# Patient Record
Sex: Female | Born: 1940 | Race: White | Hispanic: No | Marital: Married
Health system: Midwestern US, Academic
[De-identification: ages and names within clinical notes are randomized; demographics above are authoritative.]

---

## 2017-02-21 LAB — THYROID STIMULATING HORMONE-TSH: Lab: 1.9 — ABNORMAL HIGH (ref 3.5–5.1)

## 2017-02-21 LAB — CBC: Lab: 7.4

## 2017-02-21 LAB — BASIC METABOLIC PANEL
Lab: 0.8 — ABNORMAL LOW (ref 33.0–37.0)
Lab: 102 — ABNORMAL LOW (ref 37.0–47.0)
Lab: 103
Lab: 136 — ABNORMAL LOW (ref 4.20–5.40)
Lab: 15 — ABNORMAL HIGH (ref 0–14)
Lab: 17
Lab: 24
Lab: 67
Lab: 78
Lab: 9.9

## 2017-03-03 ENCOUNTER — Encounter: Admit: 2017-03-03 | Discharge: 2017-03-03 | Payer: MEDICARE

## 2017-03-03 DIAGNOSIS — R011 Cardiac murmur, unspecified: Principal | ICD-10-CM

## 2017-03-04 ENCOUNTER — Encounter: Admit: 2017-03-04 | Discharge: 2017-03-04 | Payer: MEDICARE

## 2017-03-04 DIAGNOSIS — R0600 Dyspnea, unspecified: ICD-10-CM

## 2017-03-04 DIAGNOSIS — E785 Hyperlipidemia, unspecified: ICD-10-CM

## 2017-03-04 DIAGNOSIS — R55 Syncope and collapse: ICD-10-CM

## 2017-03-04 DIAGNOSIS — R011 Cardiac murmur, unspecified: Principal | ICD-10-CM

## 2017-03-04 DIAGNOSIS — R5383 Other fatigue: ICD-10-CM

## 2017-03-04 NOTE — Nursing Note
Dr. Orson GearJessica Stone requested soon OV for evaluation near syncope, murmur, moderate mitral regurg, moderate aortic regurg.  Patient reports she had an near syncopal episode appox 3 weeks ago and has had some chest "burning", but no sx have improved and she now reports fatigue.  Echo report received.

## 2017-03-07 ENCOUNTER — Encounter: Admit: 2017-03-07 | Discharge: 2017-03-07 | Payer: MEDICARE

## 2017-03-07 ENCOUNTER — Ambulatory Visit: Admit: 2017-03-07 | Discharge: 2017-03-08 | Payer: MEDICARE

## 2017-03-07 DIAGNOSIS — R011 Cardiac murmur, unspecified: Principal | ICD-10-CM

## 2017-03-07 DIAGNOSIS — R55 Syncope and collapse: ICD-10-CM

## 2017-03-07 DIAGNOSIS — R5383 Other fatigue: ICD-10-CM

## 2017-03-07 DIAGNOSIS — E782 Mixed hyperlipidemia: ICD-10-CM

## 2017-03-07 DIAGNOSIS — R0600 Dyspnea, unspecified: ICD-10-CM

## 2017-03-07 DIAGNOSIS — E785 Hyperlipidemia, unspecified: Secondary | ICD-10-CM

## 2017-03-07 DIAGNOSIS — I10 Essential (primary) hypertension: ICD-10-CM

## 2017-03-07 NOTE — Progress Notes
Date of Service: 03/07/2017    Brianna Christensen is a 76 y.o. female.       HPI     This is a delightfully pleasant 76 year old female whom is referred to the department of cardiovascular medicine clinic of the [facility] system at the [place], [place] office today for initial cardiovascular consultation.  She recently experienced 2 episodes of feeling presyncopal.  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  The second occurred while fishing when she tripped over a tackle box.  She did not suffer a loss of consciousness.  She had a sensation of her heart racing.  She thinks that her blood pressure was low at the time.  She is checked her blood pressure at home recently and gotten values of 118/69 mmHg with a pulse rate of around 89 bpm.  She did not have heart rate or blood pressure data from the time of the events.  Of note, her home readings are obtained on a wrist blood pressure cuff.  She was seen her primary care provider office and noted to be hypertensive with a systolic blood pressure of 156/78 mmHg.  She was found to have a newly discovered cardiac murmur and therefore referred for an echocardiogram.    Her transthoracic echocardiogram was performed at the [facility] on February 25, 2017.  He received a copy of the report but not the images.  The left ventricular ejection fraction was 62% by Simpson biplane.  The atrium were normal in size.  The left ventricle was felt to be normal in size.  Right ventricle was felt to be mildly dilated.  The aortic valve was noted to have 3 cusps with a mildly thickened and calcific appearance.  The aortic valve peak velocity was not documented.  The mean gradient was 7 mmHg there was felt to be moderate aortic regurgitation the mitral valve was noted to have a mildly thickened appearance with moderate central mitral regurgitation.  There was mild tricuspid regurgitation with a estimated peak RV systolic pressure of 35 mmHg.  She also had recent lab work performed on February 21, 2017.  A CBC was pertinent only for a hemoglobin that was slightly low at 11.7 g/dL, a BMP showed a potassium of 5.2 and anion gap of 15 but was otherwise unremarkable.  A fasting lipid profile performed on November 2017 showed a total cholesterol of 183, triglycerides 73, HDL 66, and LDL of 95.      Upon review of systems, the patient denies chest discomfort.  She denies dyspnea on exertion.  She denies orthopnea, PND, or any lower extremity edema.  She denies any separate episodes of palpitations exclusive of the 2 mentioned events.  She denies symptoms of claudication.  She denies fevers or chills.  She denies claudication.  She denies focal neurologic deficits.         Vitals:    03/07/17 1345 03/07/17 1418   BP: 152/90 162/82   Pulse: 80    Weight: 84.9 kg (187 lb 3.2 oz)    Height: 1.651 m (5' 5)      Body mass index is 31.15 kg/m???.     Past Medical History  Patient Active Problem List    Diagnosis Date Noted   ??? Essential hypertension 03/07/2017   ??? Fatigue 03/04/2017   ??? Hyperlipidemia 03/04/2017     Currently on Crestor 10 mg     ??? Near syncope 03/04/2017   ???  Murmur 03/03/2017     02/25/2017 Echo at [facility]  Mitral valve leaflets appear mildly thickened.  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  The mitral regurgitation jet was central. 2.  Trileaflet aortic valve.  The aortic cusps appear mildly thickened.  Aortic cusps appear mildly calcified.  The peak transaortic gradient was 17.00 mmHg.  The mean transaortic gradient was 7.00 mmHg.  Moderate aortic valve regurgitation.  Normal LV systolic function.  LV EF 62%.  Normal left ventricular wall thickness.            Review of Systems   Constitution: Positive for malaise/fatigue.   HENT: Negative.    Eyes: Negative.    Cardiovascular: Negative.    Respiratory: Positive for shortness of breath. Endocrine: Negative.    Hematologic/Lymphatic: Negative.    Skin: Negative.    Musculoskeletal: Negative.    Gastrointestinal: Negative.    Genitourinary: Negative.    Neurological: Positive for light-headedness.   Psychiatric/Behavioral: Negative.    Allergic/Immunologic: Negative.        Physical Exam   Nursing note and vitals reviewed.  Constitutional: She appears well-developed and well-nourished. No distress.   HENT:   Head: Normocephalic and atraumatic.   Nose: Nose normal.   Mouth/Throat: Oropharynx is clear and moist.   Eyes: Conjunctivae and EOM are normal. Pupils are equal, round, and reactive to light. No scleral icterus.   Neck: Normal range of motion. No JVD present. Carotid bruit is not present (bilaterally).   Cardiovascular: Normal rate, regular rhythm and intact distal pulses.  Exam reveals no gallop and no friction rub.    Murmur heard.   Harsh midsystolic murmur is present with a grade of 3/6  at the upper right sternal border radiating to the neck  Pulmonary/Chest: Effort normal and breath sounds normal. No respiratory distress. She has no wheezes. She has no rales.   Abdominal: Soft. Bowel sounds are normal. She exhibits no distension. There is no tenderness. There is no guarding.   Musculoskeletal: Normal range of motion. She exhibits no edema or tenderness.   Lymphadenopathy:     She has no cervical adenopathy.   Neurological: She is alert and oriented to person, place, and time. No cranial nerve deficit. Coordination normal.   Skin: Skin is warm and dry. No rash noted. She is not diaphoretic. No erythema.   Psychiatric: She has a normal mood and affect. Her behavior is normal.         Cardiovascular Studies  ECG today demonstrates sinus rhythm at a rate of 80 bpm.  The axis is normal.  The intervals are normal.  There are no prior studies available for comparison.    Problems Addressed Today  Encounter Diagnoses   Name Primary?   ??? Murmur Yes   ??? Near syncope    ??? Mixed hyperlipidemia ??? Essential hypertension        Assessment and Plan     1. Moderate aortic regurgitation and mitral regurgitation  ??? Unlikely to be the source of her symptoms at the present time  ??? However, this will require long-term monitoring.  I would like to repeat a resting echocardiogram in approximately 6 months time to evaluate for any rapid progression, which I do think will ultimately be unlikely    2. Presyncope with reported associated hypotension  ??? The patient is actually hypertensive on every objective measurements since that time.  ??? I did discuss that her symptoms are concerning for presyncope related to hypotension however.  We discussed p.o. fluid intake, which sounds adequate at this time.  ??? I did ask her to take her blood pressure cuff into her local provider office to check it against the available machine there for accuracy.  ??? I would like to check a treadmill thallium myocardial perfusion study as a screen for underlying coronary artery disease.  Although this is less likely as a cause of presyncope, females tend to have atypical symptoms.  Her risk factors include age, hypertension, and dyslipidemia.    3. Dyslipidemia  ??? Her fasting lipid profile last fall looked adequate for her currently known comorbidities.  If she does have underlying CAD however we would want to increase her rosuvastatin to at least 20 mg nightly    4. Essential hypertension  ??? Given her reported symptoms of hypotension which was symptomatic, we are not going to be aggressive with it hypertensive therapy at this time  ??? I would like her to continue to document home readings on a regular basis, preferably using a upper arm cuff.  In the meantime we did ask her to check the accuracy of her wrist cuff with that at her local healthcare provider office    Patient's questions were answered and they agreed with the above plan.  Specific instructions were typed into their After Visit Summary document. Follow-up in 6 weeks or sooner as needed.  Thank you for the opportunity to participate in the care of your patient.  Please call with questions or concerns.    Gloris Ham, MD, [facility]  [facility]  Cardiology         Current Medications (including today's revisions)  ??? acetaminophen (TYLENOL) 325 mg tablet Take 650 mg by mouth daily.   ??? ascorbic acid (VITAMIN C) 500 mg tablet Take 500 mg by mouth daily.   ??? Calcium Carbonate 600 mg calcium (1,500 mg) tab Take 1 tablet by mouth twice daily.   ??? ibuprofen (ADVIL) 200 mg tablet Take 400 mg by mouth at bedtime daily. Take with food.   ??? MULTIVITAMIN PO Take 1 tablet by mouth daily.   ??? rosuvastatin (CRESTOR) 20 mg tablet Take 10 mg by mouth daily.   ??? Vit A,C & E-Lutein-Minerals 1,000 unit-200 mg-60 unit-2 mg tab Take 1 tablet by mouth twice daily.

## 2017-03-08 ENCOUNTER — Encounter: Admit: 2017-03-08 | Discharge: 2017-03-08 | Payer: MEDICARE

## 2017-03-18 ENCOUNTER — Ambulatory Visit: Admit: 2017-03-18 | Discharge: 2017-03-19 | Payer: MEDICARE

## 2017-03-18 DIAGNOSIS — R55 Syncope and collapse: ICD-10-CM

## 2017-03-18 DIAGNOSIS — E782 Mixed hyperlipidemia: ICD-10-CM

## 2017-03-18 DIAGNOSIS — R011 Cardiac murmur, unspecified: Principal | ICD-10-CM

## 2017-03-18 NOTE — Progress Notes
Peripheral IV Insertion Note:  Patient Side: right  Line Orientation:Hand  IV Catheter Size: 22G  Number of Attempts:1.  IV capped and flushed with Normal Saline.  IV site without redness, swelling, or pain.  New dressing placed.    After procedure IV cannula removed intact and hemostasis achieved.

## 2017-04-04 ENCOUNTER — Encounter: Admit: 2017-04-04 | Discharge: 2017-04-04 | Payer: MEDICARE

## 2017-04-04 NOTE — Telephone Encounter
Left message on patient's secure voicemail asking for a return call to discuss results and recommendations.     Discussed results with JAK.  Plan to proceed with a CTA w/ FFR.      This study is probably normal with mild intensity attenuation in the anterior wall.  All segments are viable global left ventricular function is within normal limits other high risk indicators are not noted.  The patient has poor exercise capacity which placed the patient intermediate risk category but in light of the attenuation artifact anteriorly no definite regional perfusion defects are noted.  If the clinical suspicion of coronary disease is intermediate consider coronary CTA with FFR.

## 2017-04-05 ENCOUNTER — Encounter: Admit: 2017-04-05 | Discharge: 2017-04-05 | Payer: MEDICARE

## 2017-04-05 DIAGNOSIS — I10 Essential (primary) hypertension: Principal | ICD-10-CM

## 2017-04-05 DIAGNOSIS — R55 Syncope and collapse: ICD-10-CM

## 2017-04-05 DIAGNOSIS — E782 Mixed hyperlipidemia: ICD-10-CM

## 2017-04-05 NOTE — Telephone Encounter
Dicussed results and recommendations with the patient.  Pt agrees with JAK recommendations and wishes to proceed with CTA w/ FFR.  Flag sent to CCTA scheduling.

## 2017-04-05 NOTE — Telephone Encounter
-----   Message from Allen NorrisLisa Luikart, RN sent at 04/05/2017  8:43 AM CDT -----  Regarding: CTA w/ FFR needed  CTA w/ FFR ordered per JAK for borderline stress test, dyspnea, dyslipidemia, HTN and near syncope.      Thank you!    Misty StanleyLisa

## 2017-04-12 ENCOUNTER — Encounter: Admit: 2017-04-12 | Discharge: 2017-04-12 | Payer: MEDICARE

## 2017-04-18 ENCOUNTER — Encounter: Admit: 2017-04-18 | Discharge: 2017-04-18 | Payer: MEDICARE

## 2017-04-18 DIAGNOSIS — E7849 Other hyperlipidemia: Principal | ICD-10-CM

## 2017-04-28 LAB — BASIC METABOLIC PANEL
Lab: 0.8 mg/dL — ABNORMAL HIGH (ref 8.5–10.6)
Lab: 107 mg/dL — ABNORMAL HIGH (ref 70–100)
Lab: 13 U/L (ref 7–40)
Lab: 16 mg/dL (ref 0.4–1.24)
Lab: 25 mg/dL (ref 7–25)
Lab: 9.6 mg/dL (ref 0.3–1.2)
Lab: 92 g/dL (ref 6.0–8.0)

## 2017-05-03 ENCOUNTER — Ambulatory Visit: Admit: 2017-05-03 | Discharge: 2017-05-04 | Payer: MEDICARE

## 2017-05-03 ENCOUNTER — Ambulatory Visit: Admit: 2017-05-03 | Discharge: 2017-05-03 | Payer: MEDICARE

## 2017-05-03 ENCOUNTER — Encounter: Admit: 2017-05-03 | Discharge: 2017-05-03 | Payer: MEDICARE

## 2017-05-03 DIAGNOSIS — E782 Mixed hyperlipidemia: ICD-10-CM

## 2017-05-03 DIAGNOSIS — R9439 Abnormal result of other cardiovascular function study: ICD-10-CM

## 2017-05-03 DIAGNOSIS — I10 Essential (primary) hypertension: Principal | ICD-10-CM

## 2017-05-03 MED ORDER — SODIUM CHLORIDE 0.9 % IJ SOLN
100 mL | Freq: Once | INTRAVENOUS | 0 refills | Status: CP
Start: 2017-05-03 — End: ?
  Administered 2017-05-03: 18:00:00 100 mL via INTRAVENOUS

## 2017-05-03 MED ORDER — METOPROLOL TARTRATE 5 MG/5 ML IV SOLN
5 mg | Freq: Once | INTRAVENOUS | 0 refills | Status: CP
Start: 2017-05-03 — End: ?
  Administered 2017-05-03: 18:00:00 5 mg via INTRAVENOUS

## 2017-05-03 MED ORDER — NITROGLYCERIN 400 MCG/SPRAY TL SPRY
1-2 | Freq: Once | 0 refills | Status: CP
Start: 2017-05-03 — End: ?
  Administered 2017-05-03: 18:00:00 2

## 2017-05-03 MED ORDER — IOPAMIDOL 76 % IV SOLN
100 mL | Freq: Once | INTRAVENOUS | 0 refills | Status: CP
Start: 2017-05-03 — End: ?
  Administered 2017-05-03: 18:00:00 100 mL via INTRAVENOUS

## 2017-05-04 ENCOUNTER — Encounter: Admit: 2017-05-04 | Discharge: 2017-05-04 | Payer: MEDICARE

## 2017-05-11 ENCOUNTER — Encounter: Admit: 2017-05-11 | Discharge: 2017-05-11 | Payer: MEDICARE

## 2017-05-11 MED ORDER — ROSUVASTATIN 20 MG PO TAB
20 mg | ORAL_TABLET | Freq: Every day | ORAL | 3 refills | 90.00000 days | Status: AC
Start: 2017-05-11 — End: 2017-05-20

## 2017-05-11 NOTE — Telephone Encounter
-----   Message from [place] sent at 05/11/2017  2:20 PM CDT -----  Regarding: FW: please call      ----- Message -----  From: Gloris Ham, MD  Sent: 05/05/2017   3:54 PM  To: Beth Bruning  Subject: please call                                      Please call with results of patient's recent coronary CTA.  Mild to moderate CAD.  It was non-obstructive.  Let's be more aggressive with lipid lowering.  Have her go to rosuvastatin 20 mg qhs.  I also think it would be reasonable for her to take an 81 mg ASA nightly.    Thanks  Jilda Panda    ----- Message -----  From: Levander Campion Results  Sent: 05/03/2017   3:56 PM  To: Gloris Ham, MD

## 2017-05-11 NOTE — Telephone Encounter
Called and discussed results with patient.  Pt verbalized understanding.  Updated medication list.      Pt states that she spoke with Williams Che, RN regarding needing an letter to exempt her from jury duty.  Sent message to Dr. Glean Hess regarding recommendations for jury duty exemption.

## 2017-05-13 ENCOUNTER — Encounter: Admit: 2017-05-13 | Discharge: 2017-05-13 | Payer: MEDICARE

## 2017-05-13 NOTE — Telephone Encounter
-----   Message from Gloris Ham, MD sent at 05/12/2017  8:54 AM CDT -----  Regarding: RE: Jury Duty Exemption  Why does she feel exempt?  I see no obvious reason in reviewing her chart.  I would have her address with PCP.    Thanks  Jilda Panda    ----- Message -----  From: Rogelia Boga, RN  Sent: 05/11/2017   2:56 PM  To: Gloris Ham, MD  Subject: [employer] Duty Exemption                              Dr. Glean Hess,    I called to discuss results of CCTA with patient.  She states that she was waiting on a note from our office that will exempt her for jury duty.  If she qualifies for an exemption, would you be willing to addend your note to include her exemption?      Thank you for your help!  Asher Muir

## 2017-05-13 NOTE — Telephone Encounter
Called and discussed with patient.  She states that she has bad mornings and feels shaky.  Recommended that she contact her PCP.  She is agreeable to plan.  Will callback with any questions, concerns or problems.

## 2017-05-18 ENCOUNTER — Encounter: Admit: 2017-05-18 | Discharge: 2017-05-18 | Payer: MEDICARE

## 2017-05-20 ENCOUNTER — Encounter: Admit: 2017-05-20 | Discharge: 2017-05-20 | Payer: MEDICARE

## 2017-05-20 ENCOUNTER — Ambulatory Visit: Admit: 2017-05-20 | Discharge: 2017-05-21 | Payer: MEDICARE

## 2017-05-20 DIAGNOSIS — R0600 Dyspnea, unspecified: ICD-10-CM

## 2017-05-20 DIAGNOSIS — R55 Syncope and collapse: ICD-10-CM

## 2017-05-20 DIAGNOSIS — I2510 Atherosclerotic heart disease of native coronary artery without angina pectoris: Principal | ICD-10-CM

## 2017-05-20 DIAGNOSIS — E782 Mixed hyperlipidemia: ICD-10-CM

## 2017-05-20 DIAGNOSIS — I10 Essential (primary) hypertension: ICD-10-CM

## 2017-05-20 DIAGNOSIS — R011 Cardiac murmur, unspecified: Principal | ICD-10-CM

## 2017-05-20 DIAGNOSIS — R5383 Other fatigue: ICD-10-CM

## 2017-05-20 DIAGNOSIS — E785 Hyperlipidemia, unspecified: Secondary | ICD-10-CM

## 2017-05-20 MED ORDER — CARVEDILOL 6.25 MG PO TAB
6.25 mg | ORAL_TABLET | Freq: Two times a day (BID) | ORAL | 3 refills | 90.00000 days | Status: AC
Start: 2017-05-20 — End: 2018-01-09

## 2017-05-20 MED ORDER — ROSUVASTATIN 20 MG PO TAB
20 mg | ORAL_TABLET | Freq: Every day | ORAL | 3 refills | 90.00000 days | Status: AC
Start: 2017-05-20 — End: 2018-03-31

## 2017-05-20 NOTE — Progress Notes
Date of Service: 05/20/2017    Brianna Christensen is a 76 y.o. female.       HPI     This is a delightfully pleasant 76 year old female whom is seen at the department of cardiovascular medicine clinic at the [place], [place] office today for ongoing cardiovascular care.  We initially met in August 2018.  She was referred for 2 episodes of presyncope.  In hindsight, it sounds like these were actually more of a sensation of vertigo or disequilibrium rather than presyncope.  She does have known hypertension and dyslipidemia.  She is never been a smoker.  She had an echocardiogram performed through the [facility] on February 25, 2017.  We received a copy of the report but not the images.  The left ventricular ejection fraction was 62% by Simpson biplane.  The atrium were normal in size.  The left ventricle was felt to be normal in size.  Right ventricle was felt to be mildly dilated.  The aortic valve was noted to have 3 cusps with a mildly thickened and calcific appearance.  The aortic valve peak velocity was not documented.  The mean gradient was 7 mmHg there was felt to be moderate aortic regurgitation the mitral valve was noted to have a mildly thickened appearance with moderate central mitral regurgitation.  There was mild tricuspid regurgitation with a estimated peak RV systolic pressure of 35 mmHg.    In follow-up we had her perform a Bruce treadmill thallium myocardial perfusion study.  This was on March 18, 2017.  There is felt to be a mild intensity defect in the anterior wall.  It was somewhat indeterminate.  The calculated ejection fraction was 66% with no regional wall motion or thickening abnormalities.  Given the somewhat equivocal nature of the findings, we pursued a coronary CTA, which was performed on May 04, 2017.  This study demonstrated mild to moderate areas of mixed plaquing within the LAD, diagonal, and distal RCA.  This plaquing was assessed by noninvasive fractional flow reserve, and noted to be not hemodynamically significant.    As result of this testing, we know that she does have coronary artery disease, in addition to the moderate aortic and mitral valve regurgitation.  All of these things will require long-term management, but no acute intervention at this time.  We increased her rosuvastatin to 20 mg nightly.  We have asked her to start checking blood pressure on a regular basis, but this is not yet occurred.  She reports no new complaints at this time.  She has not had any further episodes of presyncope.  She does continue to state that her equilibrium feels off.  She denies dyspnea, orthopnea, PND, or lower external edema.  She had a couple episodes of atypical chest pain, which quite frankly sound musculoskeletal in etiology.  She denies palpitations.  No symptoms of claudication.  No focal neurologic deficits.  She remains compliant with her medical therapy.         Vitals:    05/20/17 0752 05/20/17 0759   BP: 166/76 162/74   Pulse: 79    Weight: 87.5 kg (193 lb)    Height: 1.651 m (5' 5)      Body mass index is 32.12 kg/m???.     Past Medical History  Patient Active Problem List    Diagnosis Date Noted   ??? CAD (coronary artery disease) 05/18/2017     05/03/17 - CCTA  at [facility] - Mild to moderate areas of  mixed plaquing noted. Though FFR assessment suggested no significant obstructive coronary artery disease.  03/18/17 - MPI Stress Test at [facility] - 1. This study is probably normal with mild intensity attenuation in the anterior wall.  All segments are viable global left ventricular function is within normal limits other high risk indicators are not noted.  The patient has poor exercise capacity which placed the patient intermediate risk category but in light of the attenuation artifact anteriorly no definite regional perfusion defects are noted.  If the clinical suspicion of coronary disease is intermediate consider coronary CTA with FFR. ??? Essential hypertension 03/07/2017   ??? Fatigue 03/04/2017   ??? Hyperlipidemia 03/04/2017     03/18/17 - MPI Stress Test at [facility] - 1. This study is probably normal with mild intensity attenuation in the anterior wall.  All segments are viable global left ventricular function is within normal limits other high risk indicators are not noted.  The patient has poor exercise capacity which placed the patient intermediate risk category but in light of the attenuation artifact anteriorly no definite regional perfusion defects are noted.  If the clinical suspicion of coronary disease is intermediate consider coronary CTA with FFR.     ??? Near syncope 03/04/2017   ??? Murmur 03/03/2017     02/25/2017 Echo at [facility]  Mitral valve leaflets appear mildly thickened.  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  The mitral regurgitation jet was central. 2.  Trileaflet aortic valve.  The aortic cusps appear mildly thickened.  Aortic cusps appear mildly calcified.  The peak transaortic gradient was 17.00 mmHg.  The mean transaortic gradient was 7.00 mmHg.  Moderate aortic valve regurgitation.  Normal LV systolic function.  LV EF 62%.  Normal left ventricular wall thickness.            Review of Systems   Constitution: Positive for malaise/fatigue.   HENT: Negative.    Eyes: Negative.    Cardiovascular: Positive for chest pain.   Respiratory: Positive for cough.    Endocrine: Negative.    Hematologic/Lymphatic: Negative.    Skin: Negative.    Musculoskeletal: Negative.    Gastrointestinal: Negative.    Genitourinary: Negative.    Neurological: Positive for loss of balance.   Psychiatric/Behavioral: Negative.    Allergic/Immunologic: Negative.        Physical Exam  Nursing note and vitals reviewed.  Constitutional: She appears well-developed and well-nourished. No distress.   HENT:   Head: Normocephalic and atraumatic.   Nose: Nose normal.   Mouth/Throat: Oropharynx is clear and moist. Eyes: Conjunctivae and EOM are normal. Pupils are equal, round, and reactive to light. No scleral icterus.   Neck: Normal range of motion. No JVD present. Carotid bruit is not present (bilaterally).   Cardiovascular: Normal rate, regular rhythm and intact distal pulses.  Exam reveals no gallop and no friction rub.    Murmur heard.   Harsh midsystolic murmur is present with a grade of 3/6  at the upper right sternal border radiating to the neck  Pulmonary/Chest: Effort normal and breath sounds normal. No respiratory distress. She has no wheezes. She has no rales.   Abdominal: Soft. Bowel sounds are normal. She exhibits no distension. There is no tenderness. There is no guarding.   Musculoskeletal: Normal range of motion. She exhibits no edema or tenderness.   Lymphadenopathy:     She has no cervical adenopathy.   Neurological: She is alert and oriented to person, place, and time. No cranial nerve deficit.  Coordination normal.   Skin: Skin is warm and dry. No rash noted. She is not diaphoretic. No erythema.   Psychiatric: She has a normal mood and affect. Her behavior is normal.     Cardiovascular Studies      Problems Addressed Today  Encounter Diagnoses   Name Primary?   ??? Coronary artery disease involving native coronary artery of native heart without angina pectoris Yes   ??? Essential hypertension    ??? Mixed hyperlipidemia    ??? Near syncope    ??? Murmur        Assessment and Plan     1. Coronary artery disease  ??? As described in detail above  ??? We will continue secondary preventative therapy with rosuvastatin 20 mg nightly and aspirin 81 mg daily  ??? She needs improved blood pressure control, more on this below    2. Essential hypertension  ??? Her blood pressure continues to be significantly elevated in the office today.  She does not seem particularly anxious to suggest white coat hypertension  ??? I would like to start her on carvedilol 6.25 mg twice daily ??? I continue to ask her to check her blood pressure on a fairly regular basis at home and to let us know if she is routinely over 150 mmHg systolic    3. Dyslipidemia  ??? Her most recent fasting lipid profile was from November 2017 with a total cholesterol 183, true glycerides 73, HDL 66, and LDL 95  ??? We have increased her rosuvastatin dose to 20 mg nightly  ??? She may have a repeat fasting lipid profile checked through her PCP office in the next 3-4 months to ensure that we are getting appropriate targets.  I would prefer her LDL less than 70    4. Valvular heart disease  ??? She has reported moderate mitral and aortic valve regurgitation on an outside echocardiogram performed in August 2018  ??? We will continue to monitor clinically and repeat a resting echo Doppler study through our office in approximately 6 months to monitor for any evidence of progression  ??? She does not require antibiotic prophylaxis at this time    Patient's questions were answered and they agreed with the above plan.  Specific instructions were typed into their After Visit Summary document.  Follow-up in 6 months or sooner as needed.  Thank you for the opportunity to participate in the care of your patient.  Please call with questions or concerns.    Gloris Ham, MD, [facility]  Department of Cardiovascular Medicine  [facility]         Current Medications (including today's revisions)  ??? acetaminophen (TYLENOL) 325 mg tablet Take 650 mg by mouth daily.   ??? ascorbic acid (VITAMIN C) 500 mg tablet Take 500 mg by mouth daily.   ??? aspirin 81 mg chewable tablet Chew 81 mg by mouth daily. Take with food.   ??? Calcium Carbonate 600 mg calcium (1,500 mg) tab Take 1 tablet by mouth twice daily.   ??? carvedilol (COREG) 6.25 mg tablet Take one tablet by mouth twice daily with meals. Take with food.   ??? ibuprofen (ADVIL) 200 mg tablet Take 400 mg by mouth at bedtime daily. Take with food.   ??? MULTIVITAMIN PO Take 1 tablet by mouth daily. ??? rosuvastatin (CRESTOR) 20 mg tablet Take one tablet by mouth daily.   ??? Vit A,C & E-Lutein-Minerals 1,000 unit-200 mg-60 unit-2 mg tab Take 1 tablet by mouth  twice daily.

## 2017-10-17 IMAGING — CR PELVIS
5 series · 5 of 5 positions shown · non-contrast
Comparison: none

[l-spine ap]
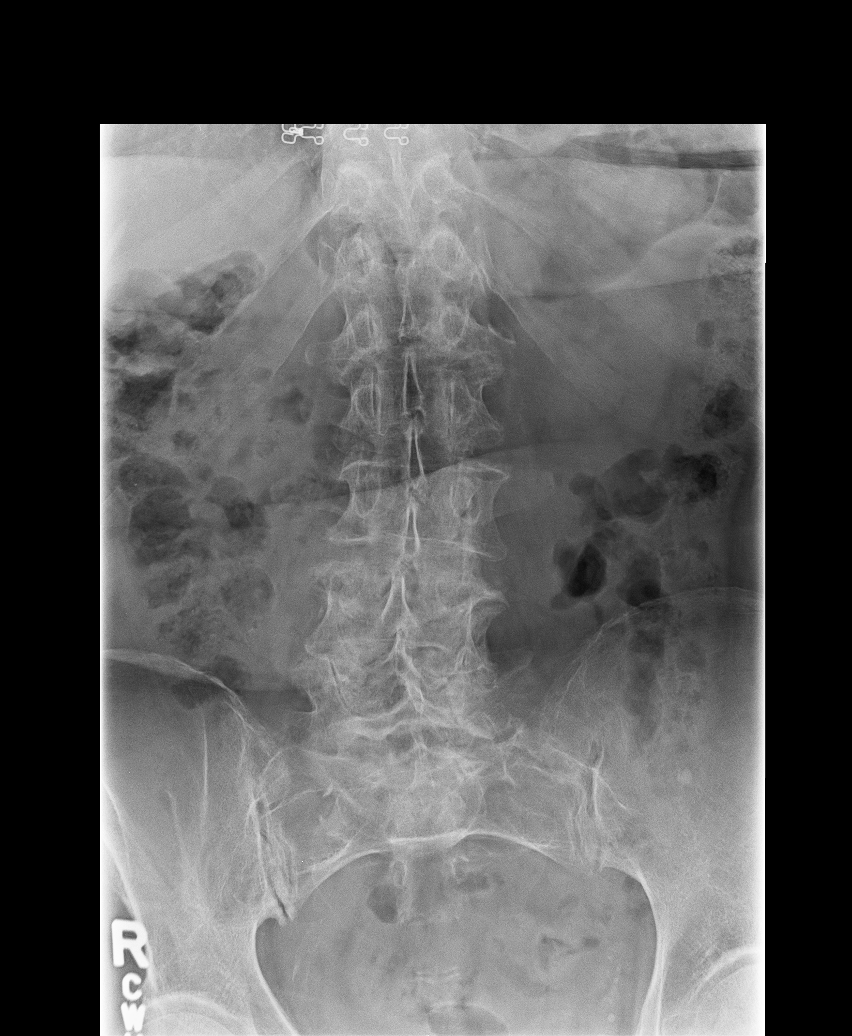

[l-spine obl (1 of 2)]
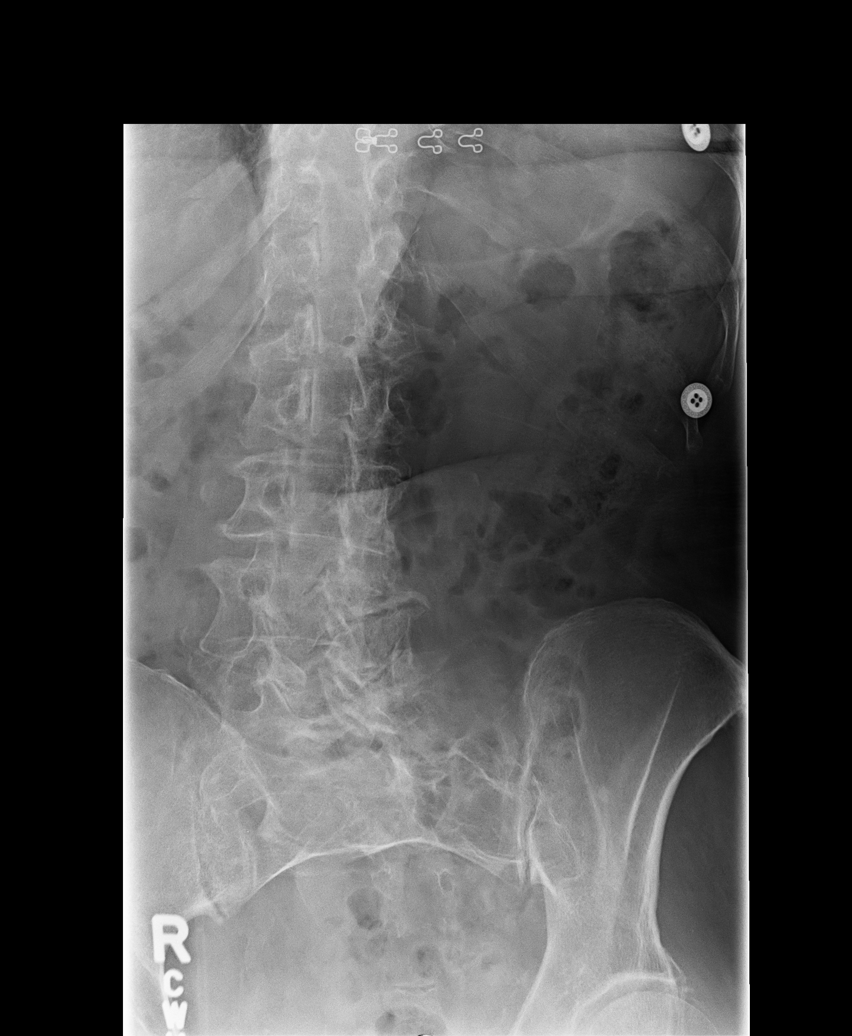

[l-spine obl (2 of 2)]
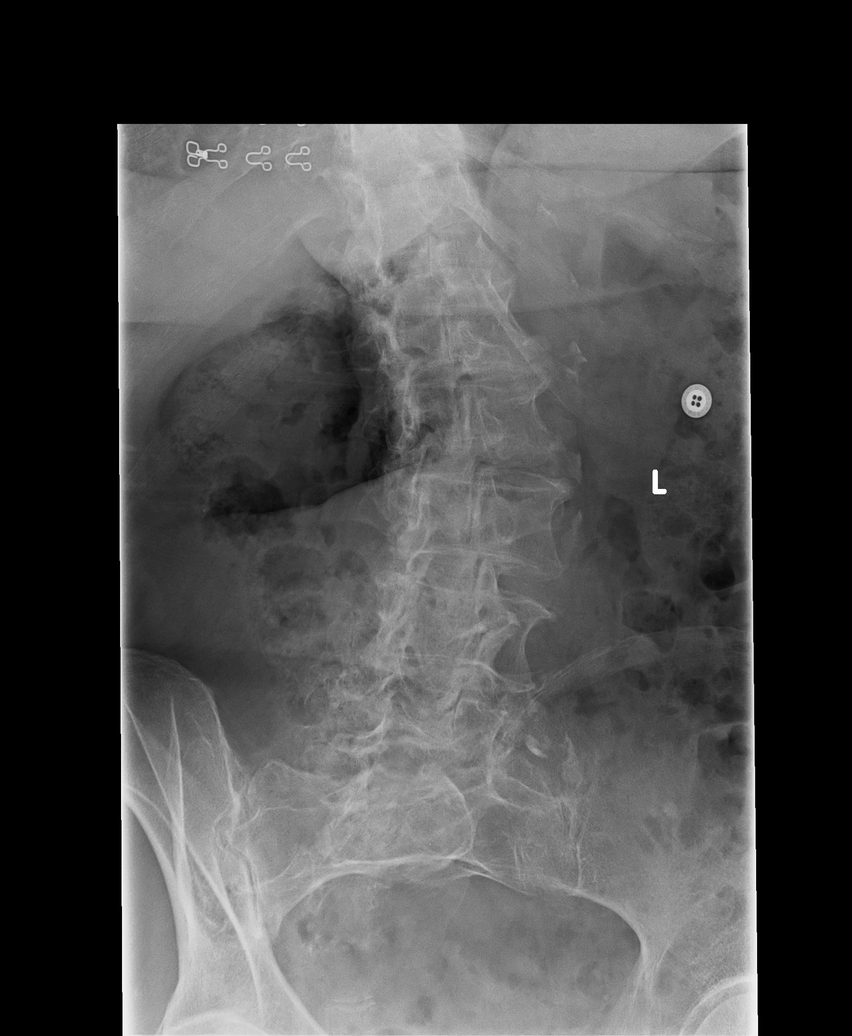

[l-spine lat]
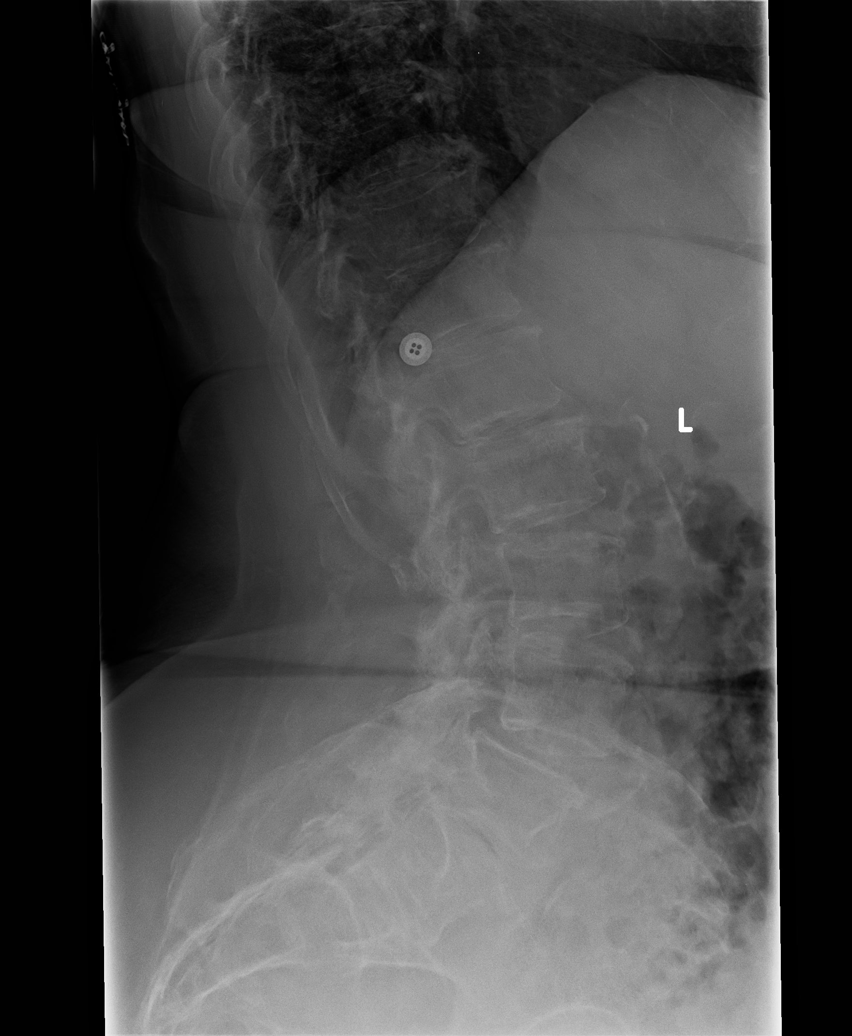

[l-spine l5-s1]
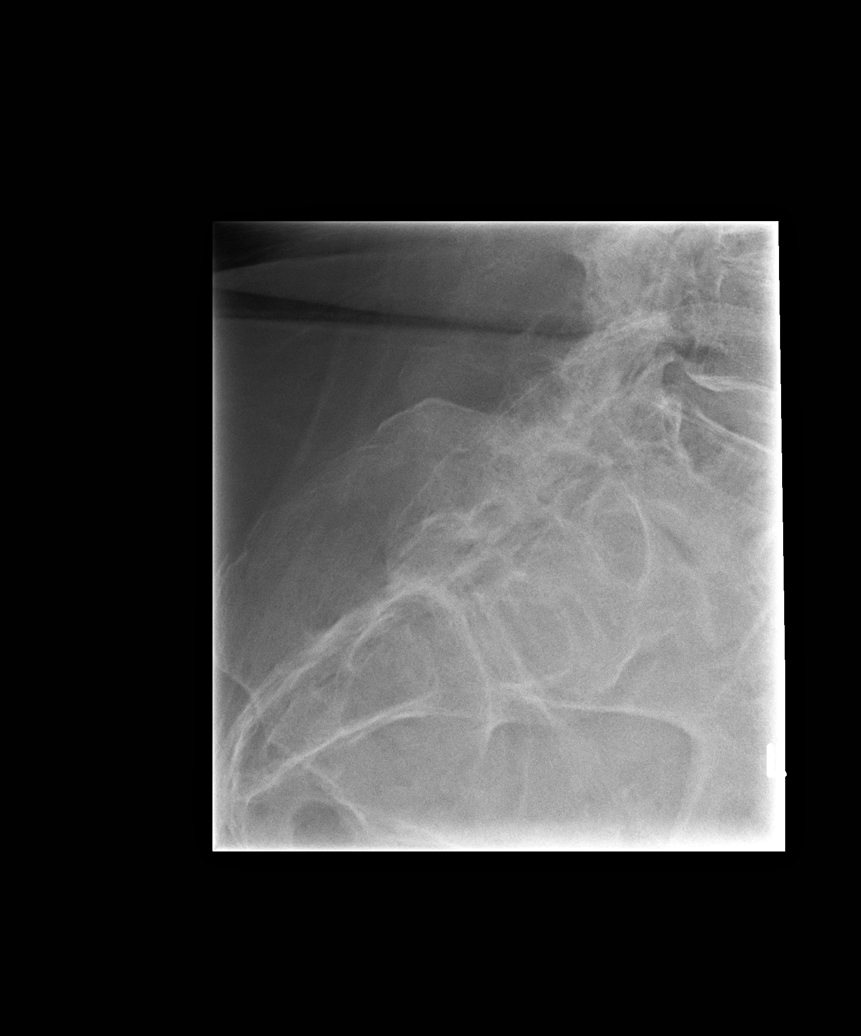

[5 of 5 positions shown; findings below may reference images not displayed]

DIAGNOSTIC STUDIES

EXAM

Exam lumbosacral spine, left hip, and left knee.

INDICATION

low back pain, more on left side
PT. CHIEF COMPLAINT IS PAIN IN HER LT. HIP AND LT. KNEE. SHE ALSO C/O LOWER
BACK PAIN.

TECHNIQUE

AP lateral both oblique and spot films of the lumbar spine were obtained. AP and lateral views of
the left hip were obtained. AP lateral patellar views of the left knee were obtained.

COMPARISONS

None available

FINDINGS

Lumbar spine:

Mild curvature of the lumbar spine to the left is noted. There is facet hypertrophy throughout the
lumbar spine. No compression fractures are seen. There is disc space narrowing at L1-2, L2-3, and L3
-4. 8 millimeters of anterior listhesis of L4 in relation L5 is evident. Marked narrowing is noted
at L5-S1 with a probable transitional vertebrae at the lumbosacral junction. Disc space narrowing
at L5-S1 is evident.

Left hip: No fractures or dislocations left hip are seen. Adjacent calcifications are felt to be
vascular. Degenerative changes of the sacroiliac joints and symphysis pubis are evident.

Left knee: No fractures or dislocations of the left knee are seen. There is narrowing of the
patellofemoral joint space and chondrocalcinosis. Spurring of the lateral compartment is noted.
Adjacent vascular calcifications are noted.

IMPRESSION

Diffuse degenerative changes lumbar spine and associated facet hypertrophy. There is mild
curvature lumbar spine to the left as well as anterior listhesis of L4 relation L5.

No fractures dislocations left hip. Degenerative changes of the sacroiliac joints and symphysis
pubis are evident.

Chondrocalcinosis of the left knee and narrowing of the patellofemoral joint space. There is
spurring of the lateral compartment. No fractures left knee are evident.

## 2017-10-17 IMAGING — CR PELVIS
3 series · 3 of 3 positions shown · non-contrast
Comparison: none

[pelvis]
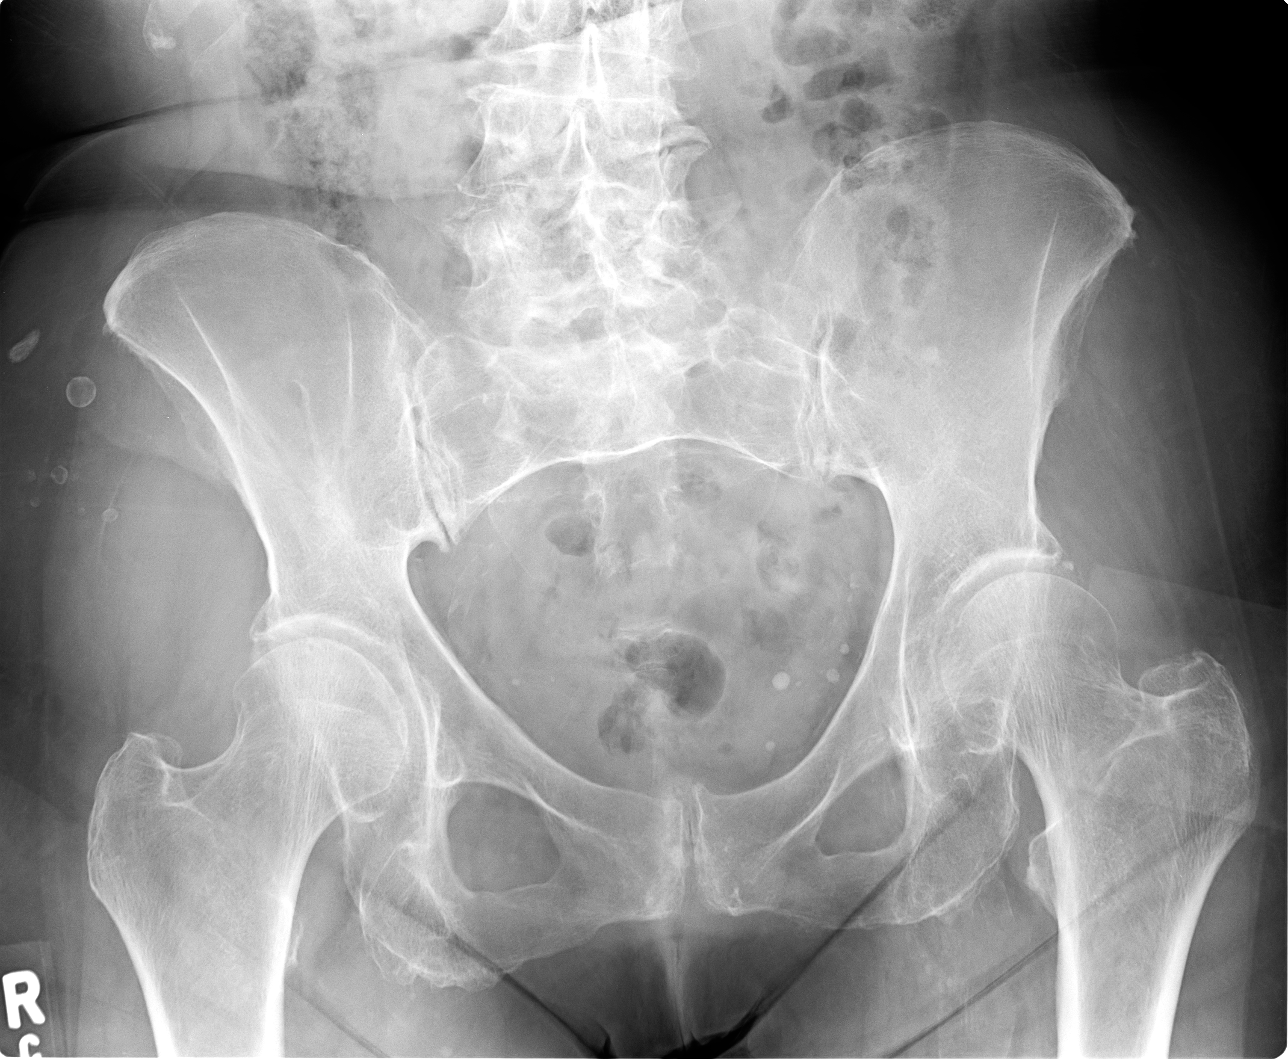

[hip ap]
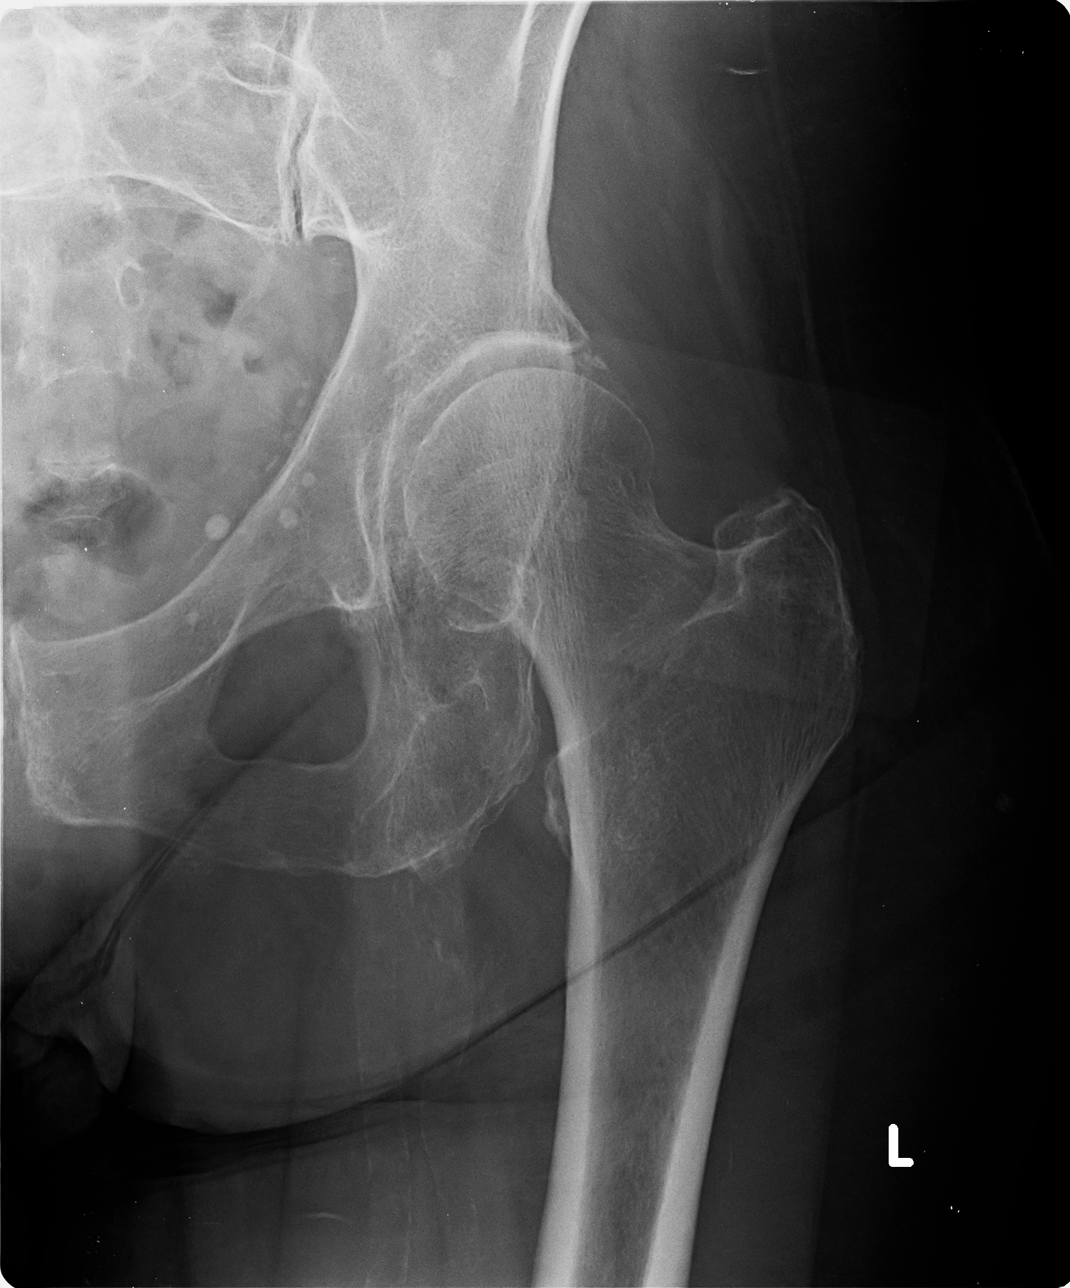

[hip frog]
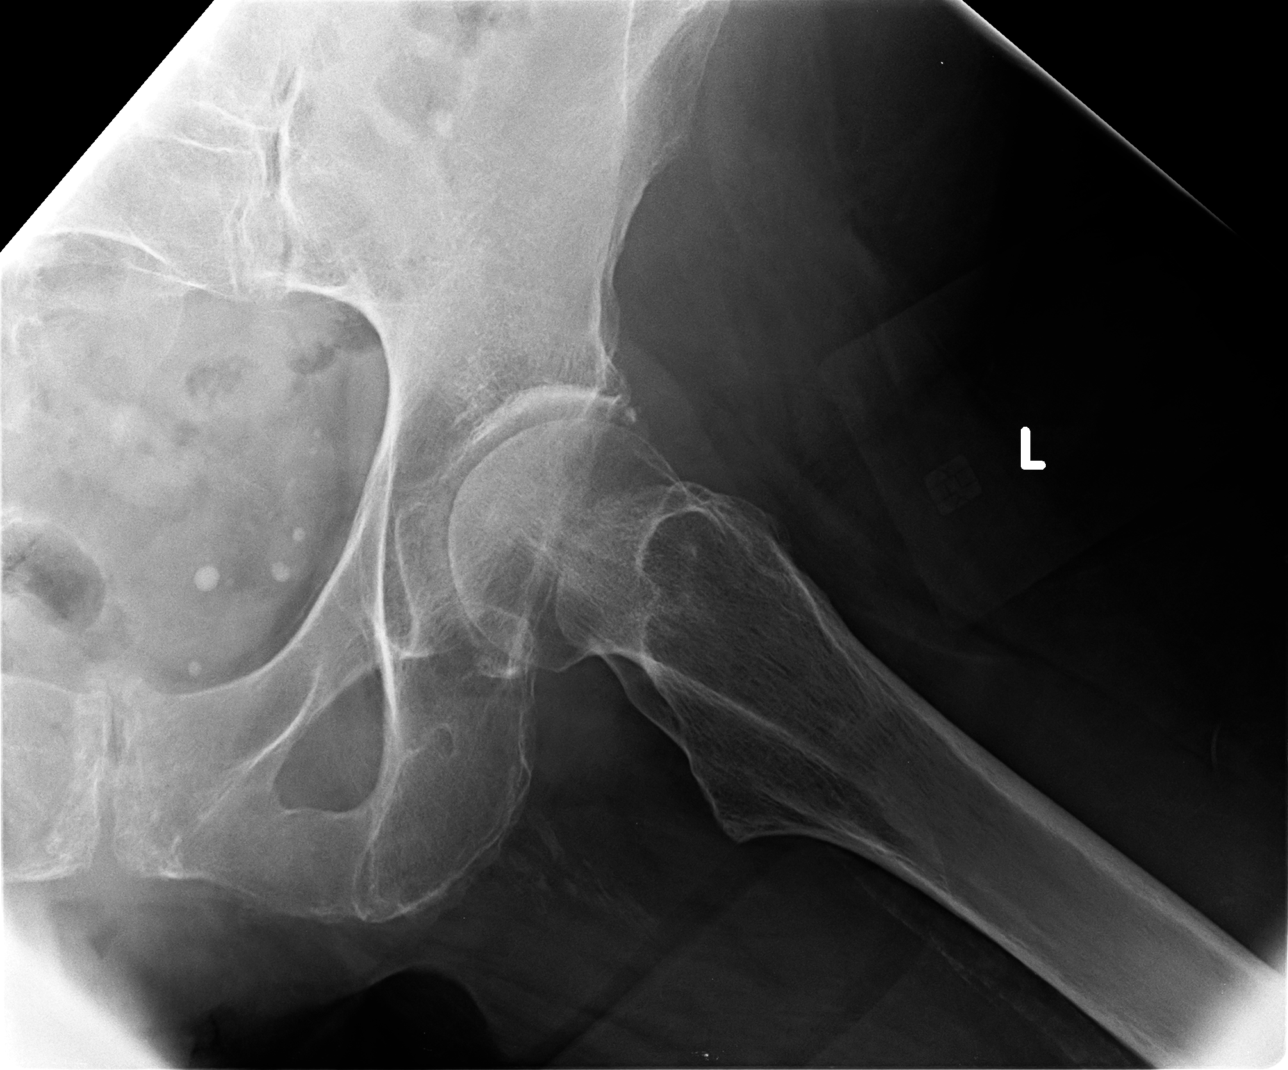

[3 of 3 positions shown; findings below may reference images not displayed]

DIAGNOSTIC STUDIES

EXAM

Exam lumbosacral spine, left hip, and left knee.

INDICATION

low back pain, more on left side
PT. CHIEF COMPLAINT IS PAIN IN HER LT. HIP AND LT. KNEE. SHE ALSO C/O LOWER
BACK PAIN.

TECHNIQUE

AP lateral both oblique and spot films of the lumbar spine were obtained. AP and lateral views of
the left hip were obtained. AP lateral patellar views of the left knee were obtained.

COMPARISONS

None available

FINDINGS

Lumbar spine:

Mild curvature of the lumbar spine to the left is noted. There is facet hypertrophy throughout the
lumbar spine. No compression fractures are seen. There is disc space narrowing at L1-2, L2-3, and L3
-4. 8 millimeters of anterior listhesis of L4 in relation L5 is evident. Marked narrowing is noted
at L5-S1 with a probable transitional vertebrae at the lumbosacral junction. Disc space narrowing
at L5-S1 is evident.

Left hip: No fractures or dislocations left hip are seen. Adjacent calcifications are felt to be
vascular. Degenerative changes of the sacroiliac joints and symphysis pubis are evident.

Left knee: No fractures or dislocations of the left knee are seen. There is narrowing of the
patellofemoral joint space and chondrocalcinosis. Spurring of the lateral compartment is noted.
Adjacent vascular calcifications are noted.

IMPRESSION

Diffuse degenerative changes lumbar spine and associated facet hypertrophy. There is mild
curvature lumbar spine to the left as well as anterior listhesis of L4 relation L5.

No fractures dislocations left hip. Degenerative changes of the sacroiliac joints and symphysis
pubis are evident.

Chondrocalcinosis of the left knee and narrowing of the patellofemoral joint space. There is
spurring of the lateral compartment. No fractures left knee are evident.

## 2017-10-17 IMAGING — CR LOW_EXM
3 series · 3 of 3 positions shown · non-contrast
Comparison: none

[knee ap]
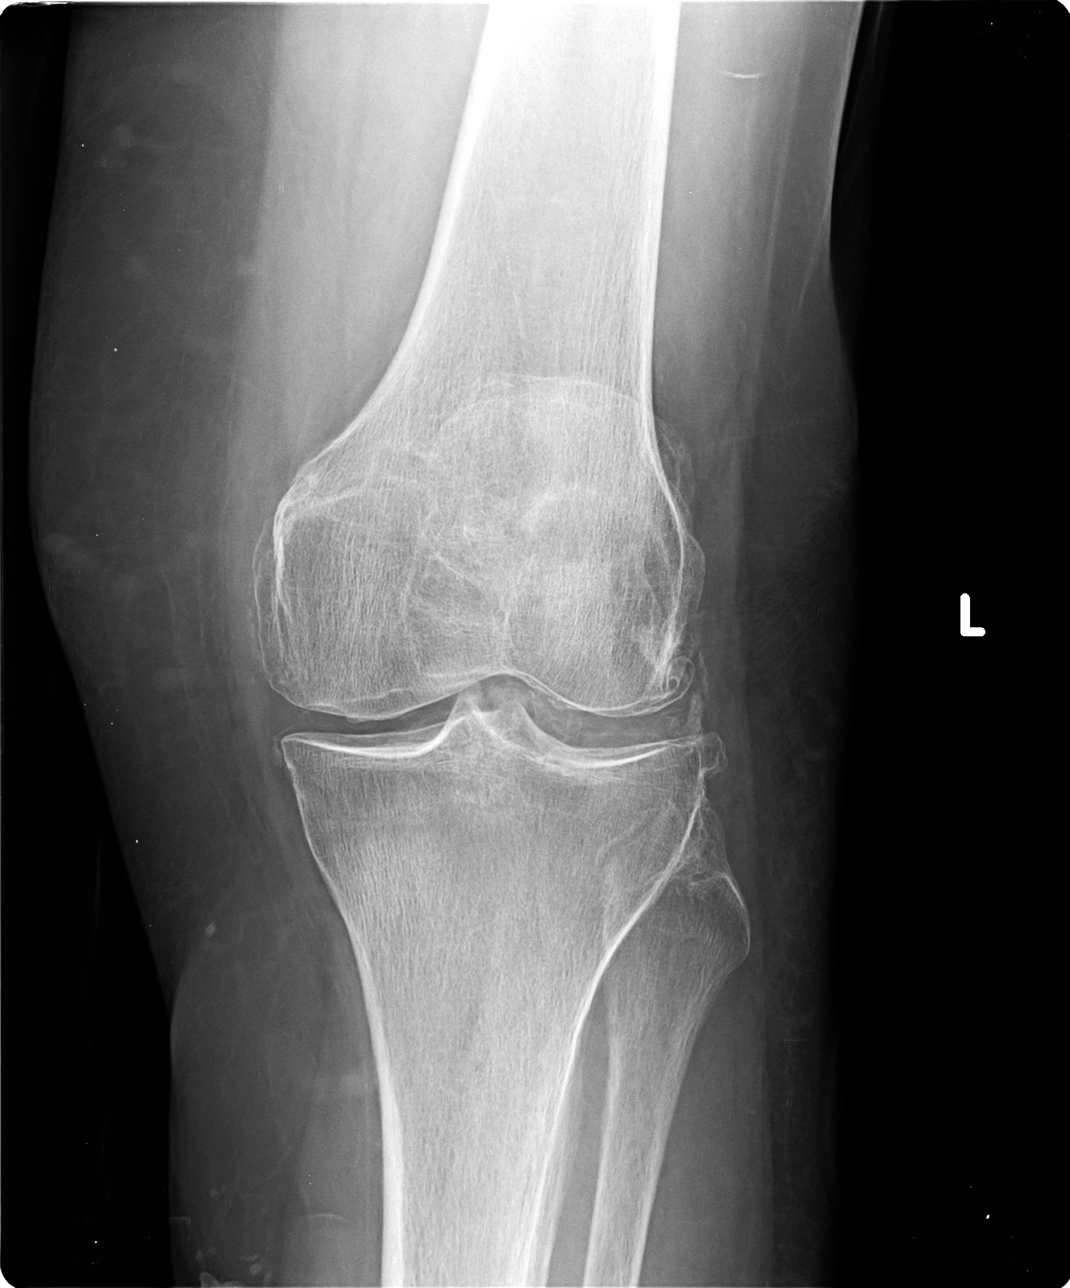

[knee sunrise]
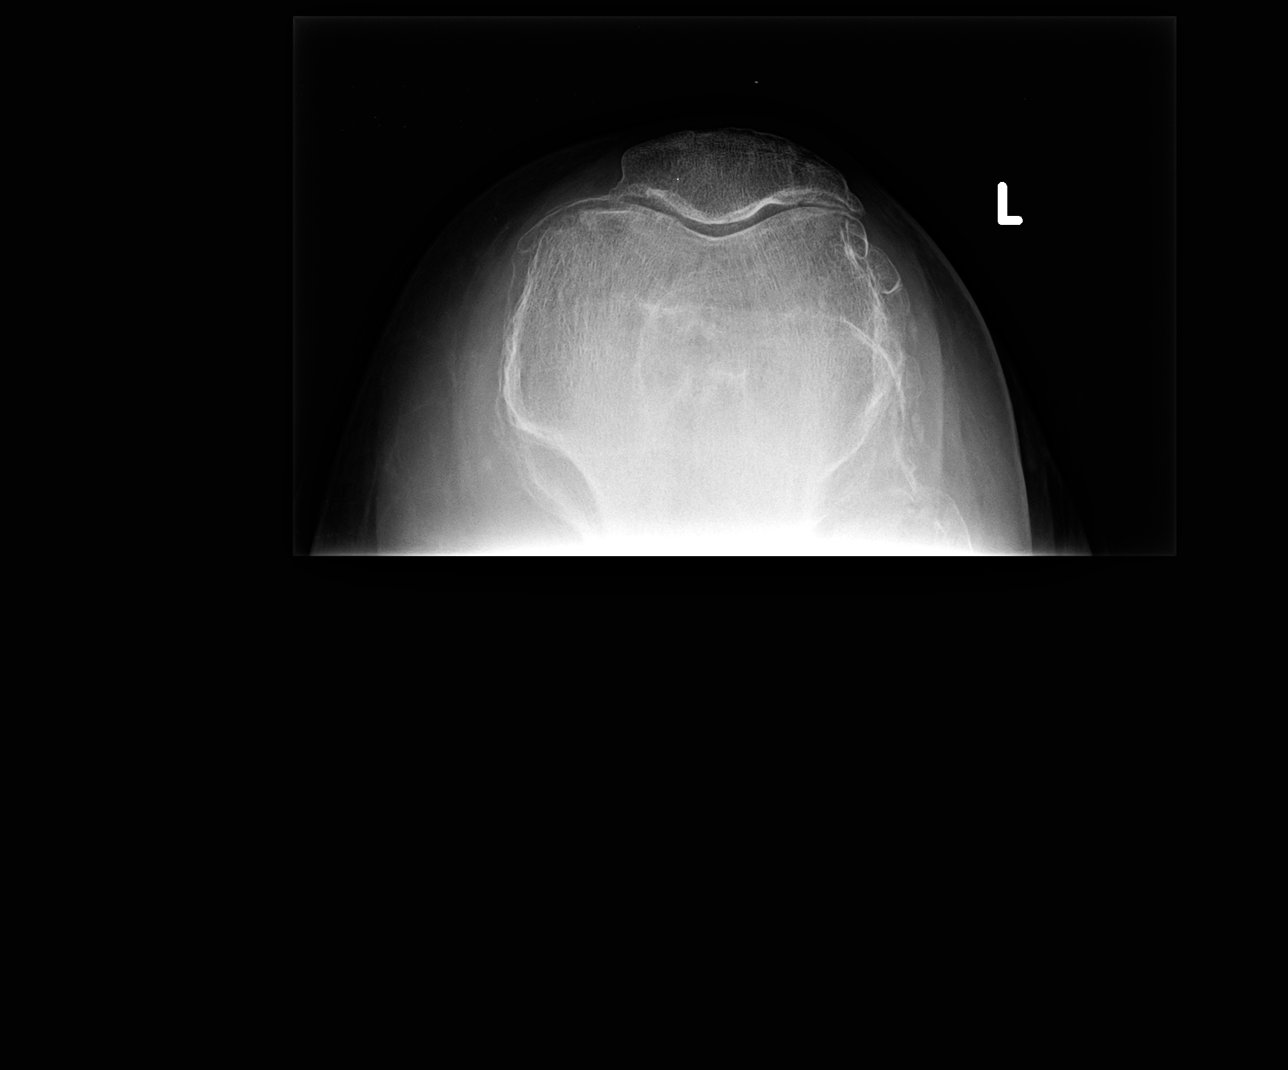

[knee lat]
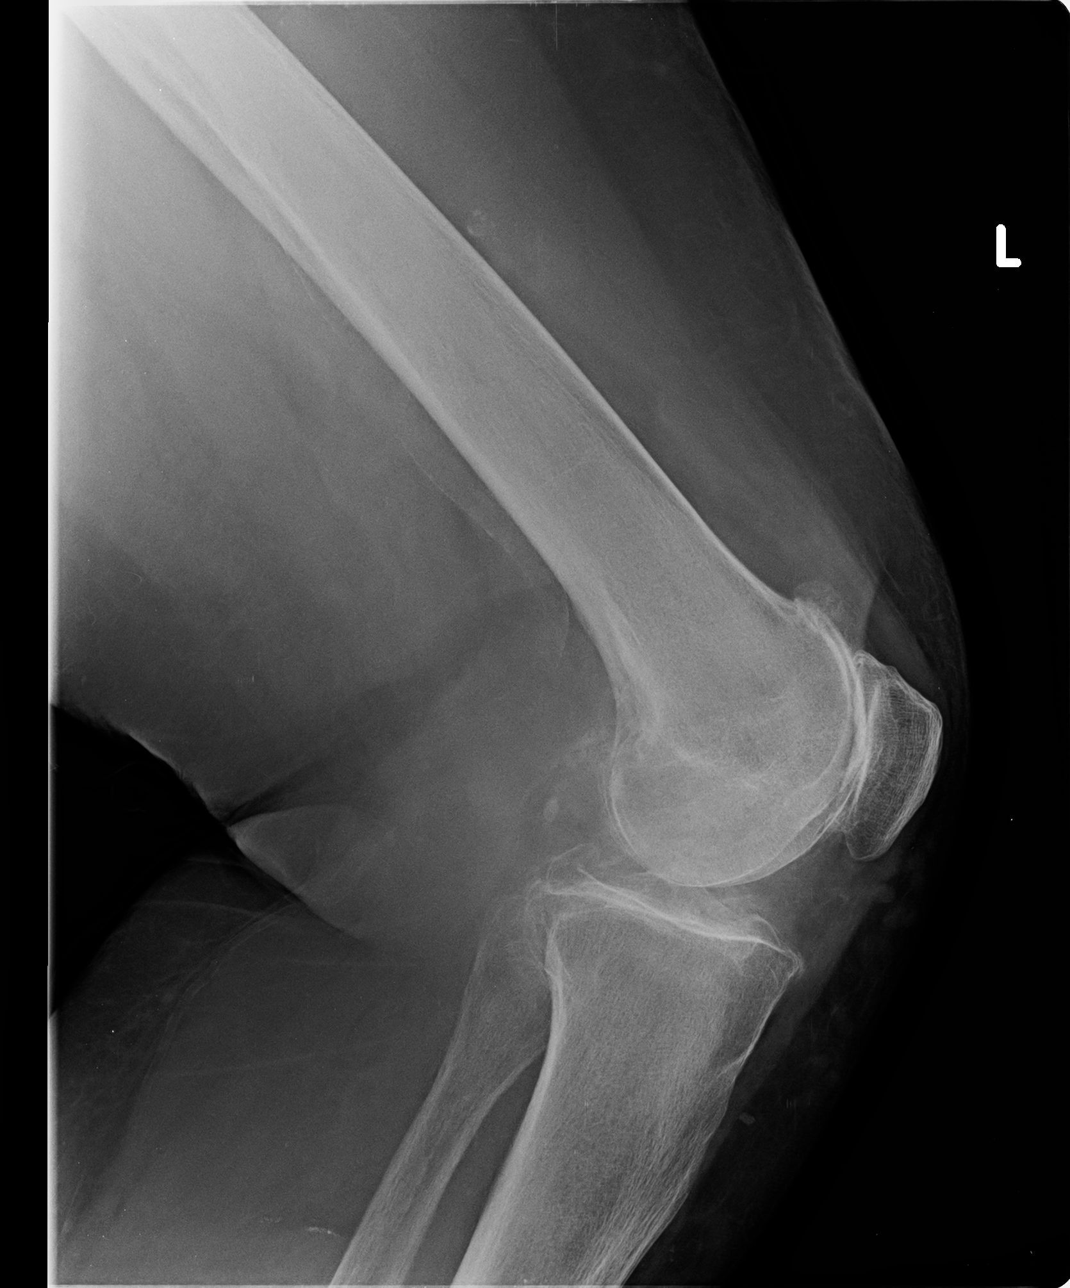

[3 of 3 positions shown; findings below may reference images not displayed]

DIAGNOSTIC STUDIES

EXAM

Exam lumbosacral spine, left hip, and left knee.

INDICATION

low back pain, more on left side
PT. CHIEF COMPLAINT IS PAIN IN HER LT. HIP AND LT. KNEE. SHE ALSO C/O LOWER
BACK PAIN.

TECHNIQUE

AP lateral both oblique and spot films of the lumbar spine were obtained. AP and lateral views of
the left hip were obtained. AP lateral patellar views of the left knee were obtained.

COMPARISONS

None available

FINDINGS

Lumbar spine:

Mild curvature of the lumbar spine to the left is noted. There is facet hypertrophy throughout the
lumbar spine. No compression fractures are seen. There is disc space narrowing at L1-2, L2-3, and L3
-4. 8 millimeters of anterior listhesis of L4 in relation L5 is evident. Marked narrowing is noted
at L5-S1 with a probable transitional vertebrae at the lumbosacral junction. Disc space narrowing
at L5-S1 is evident.

Left hip: No fractures or dislocations left hip are seen. Adjacent calcifications are felt to be
vascular. Degenerative changes of the sacroiliac joints and symphysis pubis are evident.

Left knee: No fractures or dislocations of the left knee are seen. There is narrowing of the
patellofemoral joint space and chondrocalcinosis. Spurring of the lateral compartment is noted.
Adjacent vascular calcifications are noted.

IMPRESSION

Diffuse degenerative changes lumbar spine and associated facet hypertrophy. There is mild
curvature lumbar spine to the left as well as anterior listhesis of L4 relation L5.

No fractures dislocations left hip. Degenerative changes of the sacroiliac joints and symphysis
pubis are evident.

Chondrocalcinosis of the left knee and narrowing of the patellofemoral joint space. There is
spurring of the lateral compartment. No fractures left knee are evident.

## 2017-12-06 LAB — LIPID PROFILE
Lab: 166
Lab: 2
Lab: 72 — ABNORMAL HIGH (ref 35–60)
Lab: 75
Lab: 9

## 2017-12-06 LAB — BASIC METABOLIC PANEL
Lab: 14
Lab: 141

## 2017-12-06 LAB — THYROID STIMULATING HORMONE-TSH: Lab: 2.7

## 2017-12-06 LAB — IRON, TOTAL SERUM: Lab: 64

## 2018-01-03 ENCOUNTER — Encounter: Admit: 2018-01-03 | Discharge: 2018-01-03 | Payer: MEDICARE

## 2018-01-08 IMAGING — CR CHEST
1 series · 1 of 1 positions shown · non-contrast
Comparison: none

[chest port x-wise]
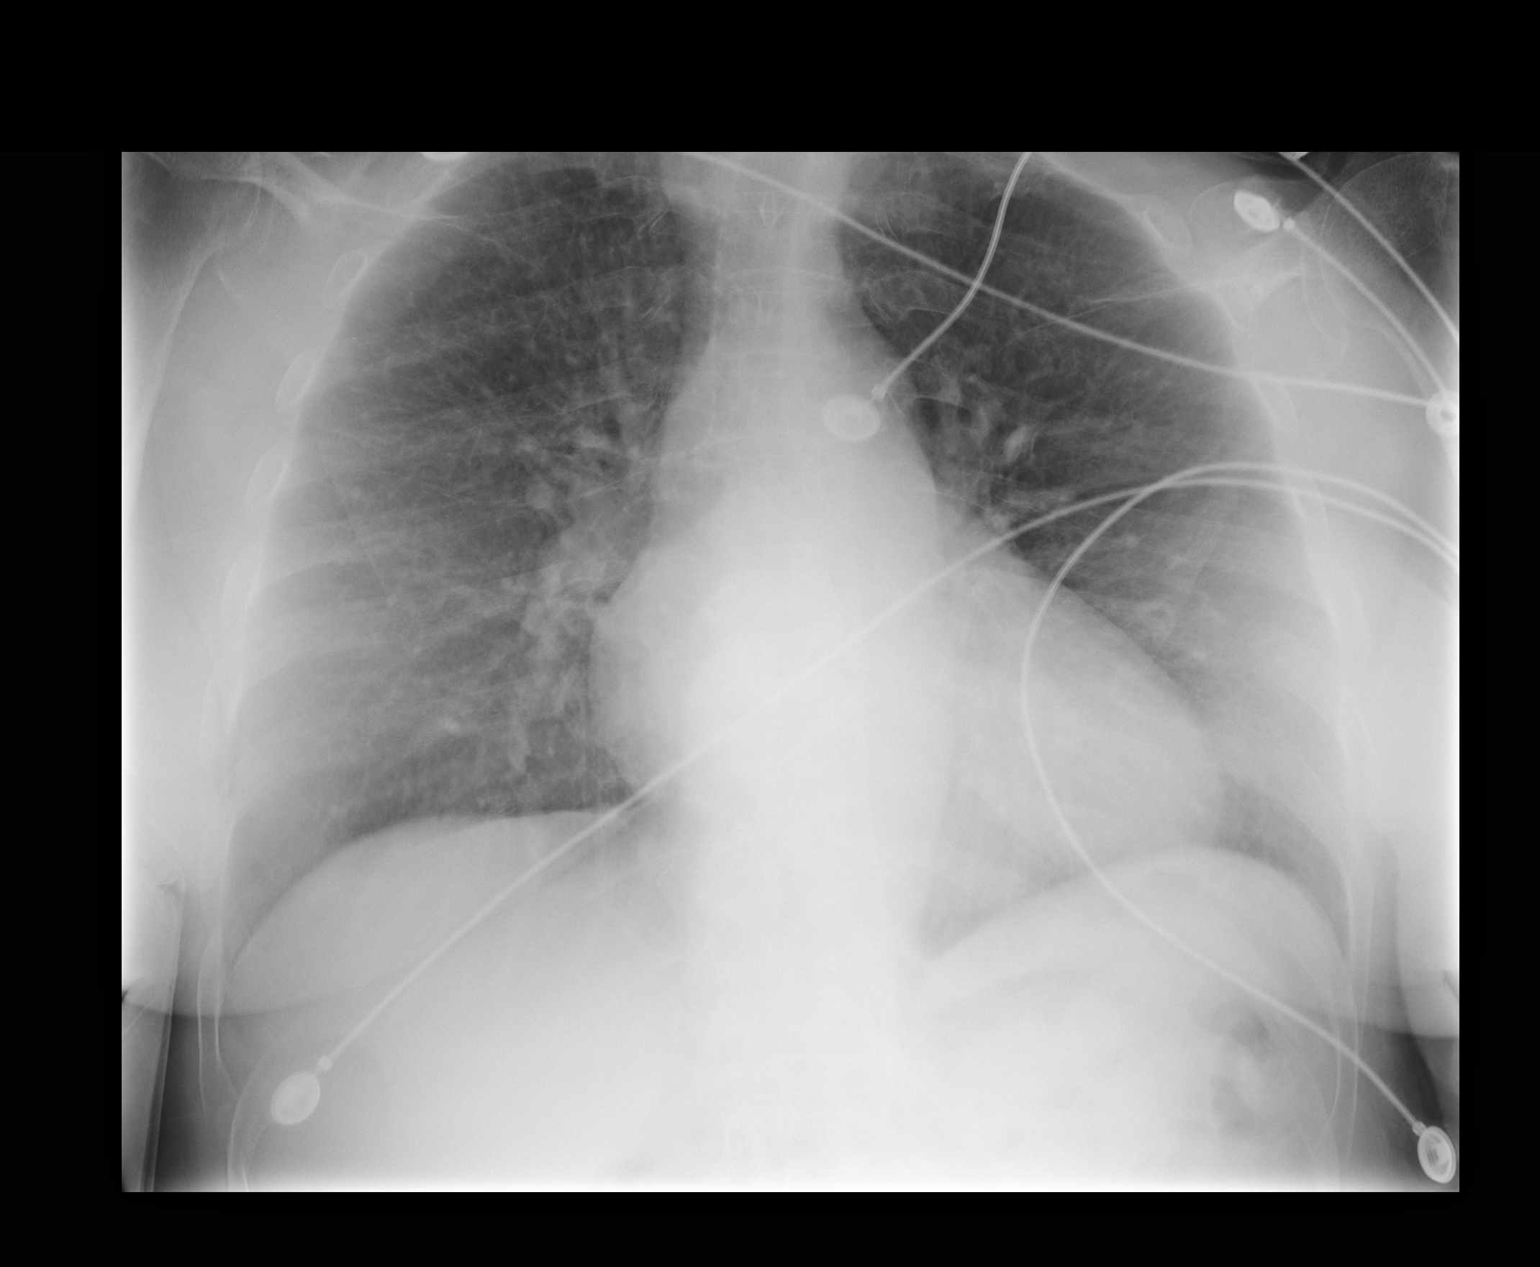

[1 of 1 positions shown; findings below may reference images not displayed]

DIAGNOSTIC STUDIES

EXAM
Chest radiograph.

INDICATION
htn
htn - AK

TECHNIQUE
AP chest.

COMPARISONS
None.

FINDINGS
There is no consolidative airspace disease, pleural effusion, or pneumothorax. Mildly thickened
interstitial markings are present. No cardiomegaly. Evidence of bilateral rotator cuff tearing.

IMPRESSION
No acute cardiopulmonary pathology.

Tech Notes:

htn - AK

## 2018-01-09 ENCOUNTER — Ambulatory Visit: Admit: 2018-01-09 | Discharge: 2018-01-10 | Payer: MEDICARE

## 2018-01-09 ENCOUNTER — Encounter: Admit: 2018-01-09 | Discharge: 2018-01-09 | Payer: MEDICARE

## 2018-01-09 DIAGNOSIS — Z01810 Encounter for preprocedural cardiovascular examination: ICD-10-CM

## 2018-01-09 DIAGNOSIS — E785 Hyperlipidemia, unspecified: ICD-10-CM

## 2018-01-09 DIAGNOSIS — R011 Cardiac murmur, unspecified: Principal | ICD-10-CM

## 2018-01-09 DIAGNOSIS — R0600 Dyspnea, unspecified: ICD-10-CM

## 2018-01-09 DIAGNOSIS — R5383 Other fatigue: ICD-10-CM

## 2018-01-09 DIAGNOSIS — I2510 Atherosclerotic heart disease of native coronary artery without angina pectoris: Principal | ICD-10-CM

## 2018-01-09 DIAGNOSIS — I351 Nonrheumatic aortic (valve) insufficiency: ICD-10-CM

## 2018-01-09 DIAGNOSIS — I10 Essential (primary) hypertension: ICD-10-CM

## 2018-01-09 DIAGNOSIS — E782 Mixed hyperlipidemia: ICD-10-CM

## 2018-01-09 DIAGNOSIS — R55 Syncope and collapse: ICD-10-CM

## 2018-01-09 MED ORDER — LISINOPRIL 10 MG PO TAB
10 mg | ORAL_TABLET | Freq: Every day | ORAL | 3 refills | Status: AC
Start: 2018-01-09 — End: 2018-01-25

## 2018-01-17 IMAGING — CT Pelvis^CONFORMIS_NEW (Adult)
1 of 3 series · 9 of 14 positions shown, 11 images · non-contrast
Comparison: none

[Series 4: knee 1.0 b60s · axial · 0.49mm/px · z∈[-691,-467]mm · 9 of 561 slices shown, 11 images]
[im 57/561  soft-tissue]
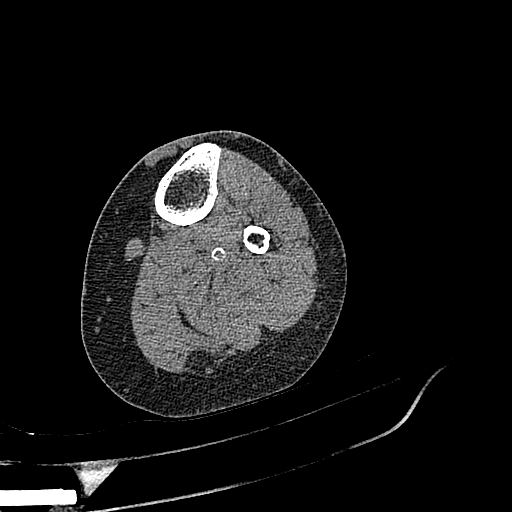
[im 57/561  bone]
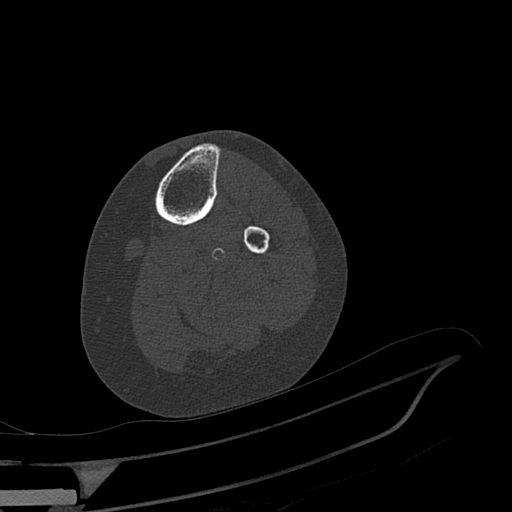
[im 113/561  soft-tissue]
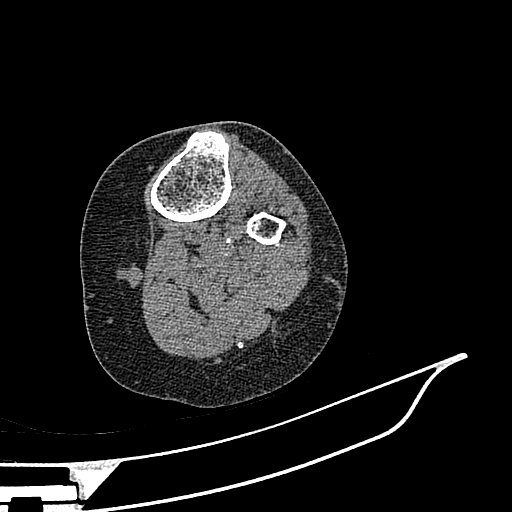
[im 169/561  soft-tissue]
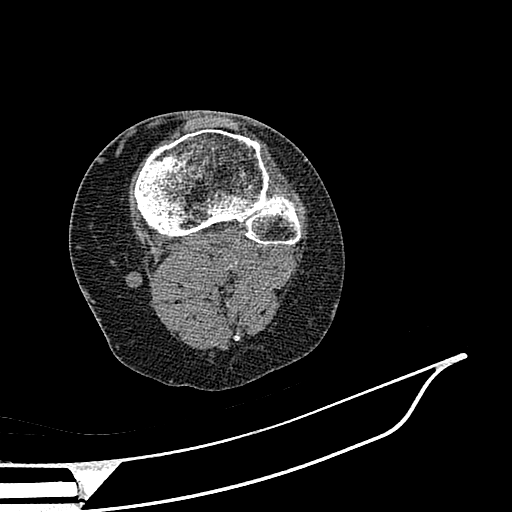
[im 225/561  soft-tissue]
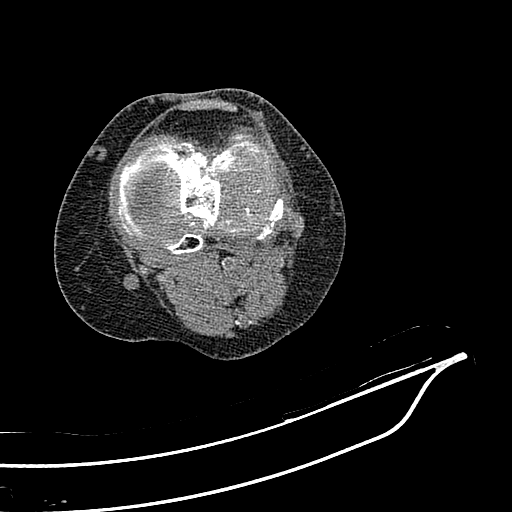
[im 281/561  soft-tissue]
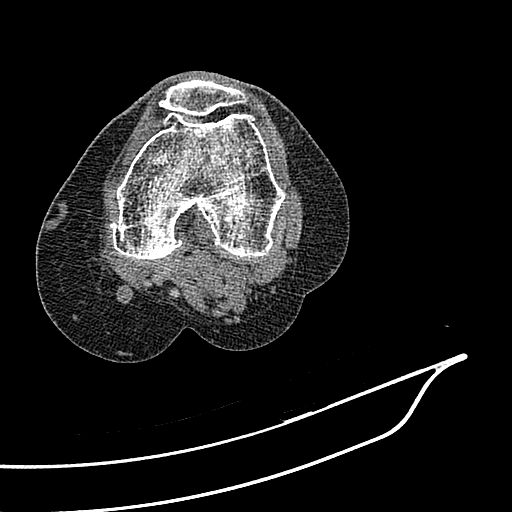
[im 337/561  soft-tissue]
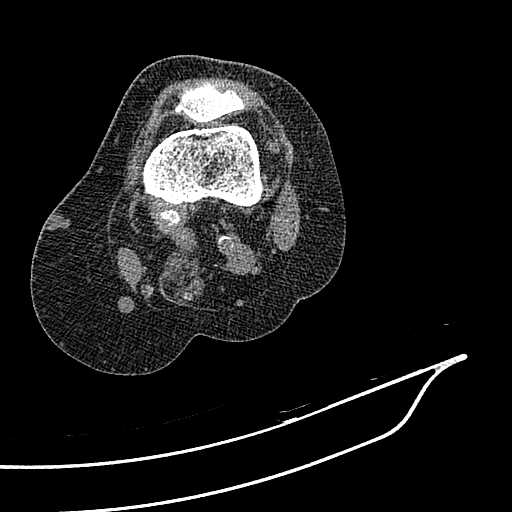
[im 393/561  soft-tissue]
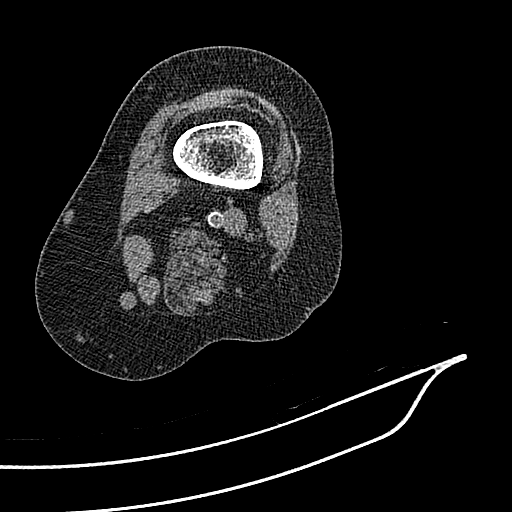
[im 449/561  soft-tissue]
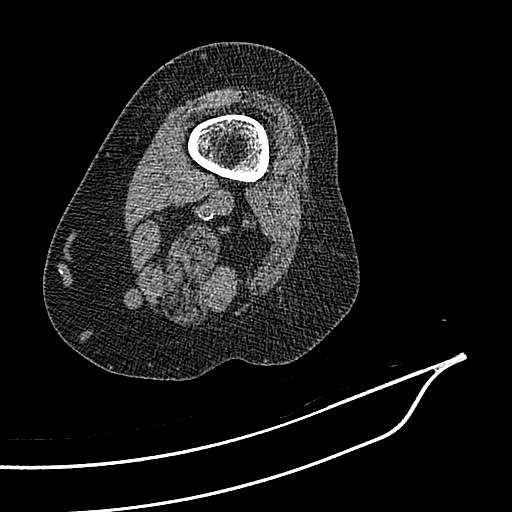
[im 505/561  soft-tissue]
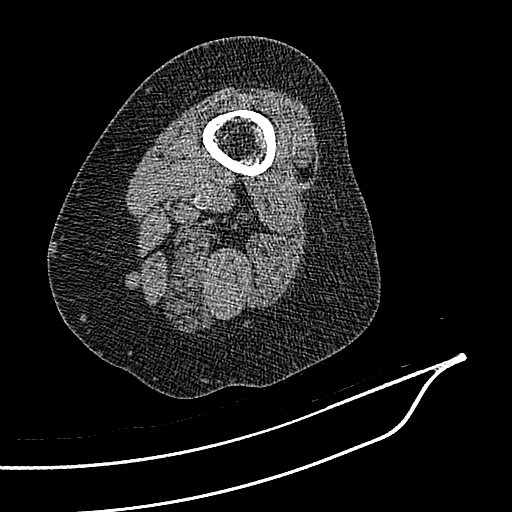
[im 505/561  bone]
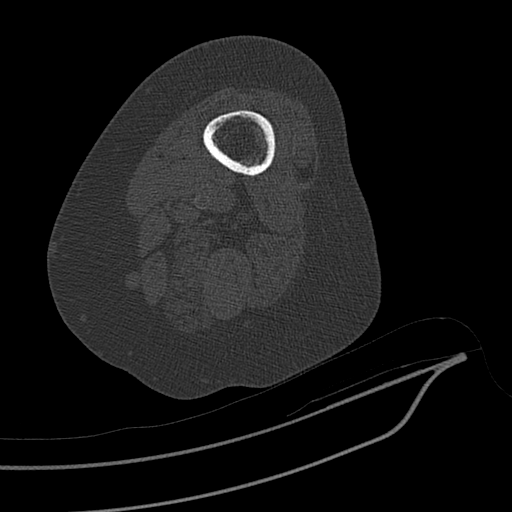

[9 of 14 positions shown; findings below may reference images not displayed]

DIAGNOSTIC STUDIES

EXAM
CT left knee conformis protocol

INDICATION
Left knee osteoarthritis, knee replacement planning.

TECHNIQUE
Contiguous transaxial imaging through the left knee was performed in the absence of intravenous
iodinated contrast material. Coronal and sagittal reformations were obtained from the transaxial
source data by the CT technologist at the workstation.
Limited axial imaging through the left ankle and hip also obtained.
All CT scans at this facility use dose modulation, iterative reconstruction, and/or weight based
dosing when appropriate to reduce radiation dose to as low as reasonably achievable.

COMPARISONS
Left knee radiographs October 17, 2017.

FINDINGS
No fracture or malalignment. No destructive bone lesion. Severe tricompartmental osteoarthritis
including chronic remodeling and sclerosis of the medial compartment joint surfaces. Patellofemoral
compartment is near bone-on-bone to bone-on-bone. Chondrocalcinosis is evident. Vascular
calcifications are present. Muscle bulk is adequate.
No acute abnormality is seen along the left ankle and hip.

IMPRESSION
Severe tricompartmental osteoarthritis.

Tech Notes:

Pt. has planned knee replacement on Left knee in [DATE].  AW

## 2018-01-23 ENCOUNTER — Encounter: Admit: 2018-01-23 | Discharge: 2018-01-23 | Payer: MEDICARE

## 2018-01-24 ENCOUNTER — Encounter: Admit: 2018-01-24 | Discharge: 2018-01-24 | Payer: MEDICARE

## 2018-01-24 MED ORDER — LISINOPRIL 20 MG PO TAB
20 mg | ORAL_TABLET | Freq: Every day | ORAL | 3 refills | Status: AC
Start: 2018-01-24 — End: 2018-09-04

## 2018-03-30 ENCOUNTER — Encounter: Admit: 2018-03-30 | Discharge: 2018-03-30 | Payer: MEDICARE

## 2018-03-30 DIAGNOSIS — E782 Mixed hyperlipidemia: ICD-10-CM

## 2018-03-30 DIAGNOSIS — I2510 Atherosclerotic heart disease of native coronary artery without angina pectoris: Principal | ICD-10-CM

## 2018-03-31 MED ORDER — ROSUVASTATIN 20 MG PO TAB
ORAL_TABLET | Freq: Every day | ORAL | 3 refills | 90.00000 days | Status: AC
Start: 2018-03-31 — End: 2018-09-04

## 2018-05-22 LAB — LIPID PROFILE
Lab: 13
Lab: 166
Lab: 2
Lab: 64
Lab: 72 — ABNORMAL HIGH (ref 35–60)
Lab: 75

## 2018-05-22 LAB — BASIC METABOLIC PANEL
Lab: 13
Lab: 140
Lab: 4.9

## 2018-05-31 LAB — CBC
Lab: 11 — ABNORMAL LOW (ref 12.0–16.0)
Lab: 12
Lab: 260
Lab: 3.7 — ABNORMAL LOW (ref 4.20–5.40)
Lab: 30
Lab: 31 — ABNORMAL LOW (ref 33.0–37.0)
Lab: 35 — ABNORMAL LOW (ref 37.0–47.0)
Lab: 7
Lab: 96

## 2018-06-12 IMAGING — MG MAMMOGRAM, DIGITAL SCREEN BILA
1 series · 4 of 4 positions shown · non-contrast
Comparison: none

[Series 2: R CC · right · 4 of 4 slices shown]
[im 1/4]
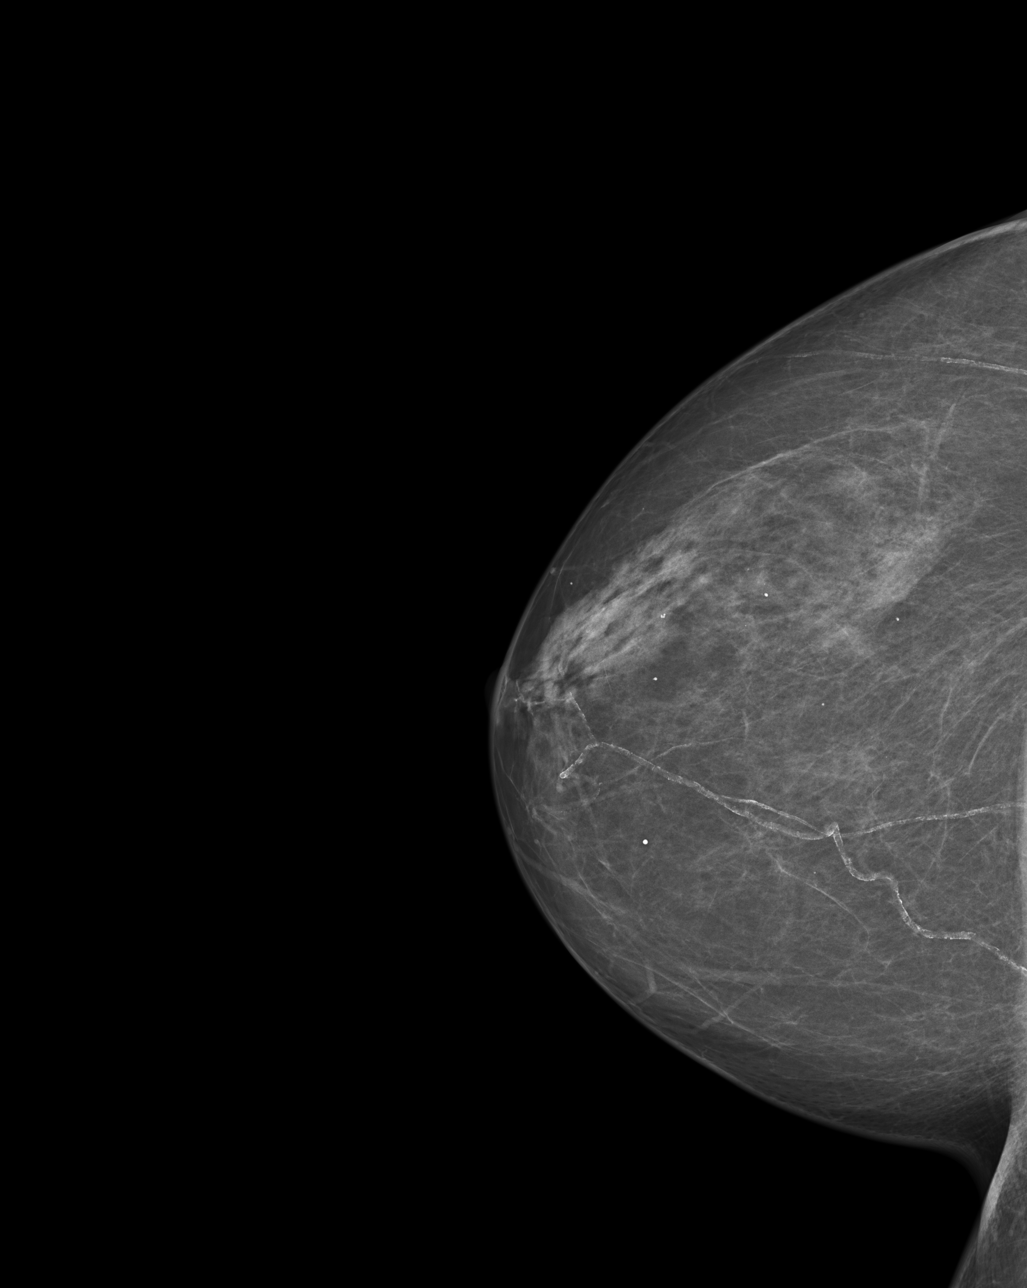
[im 2/4]
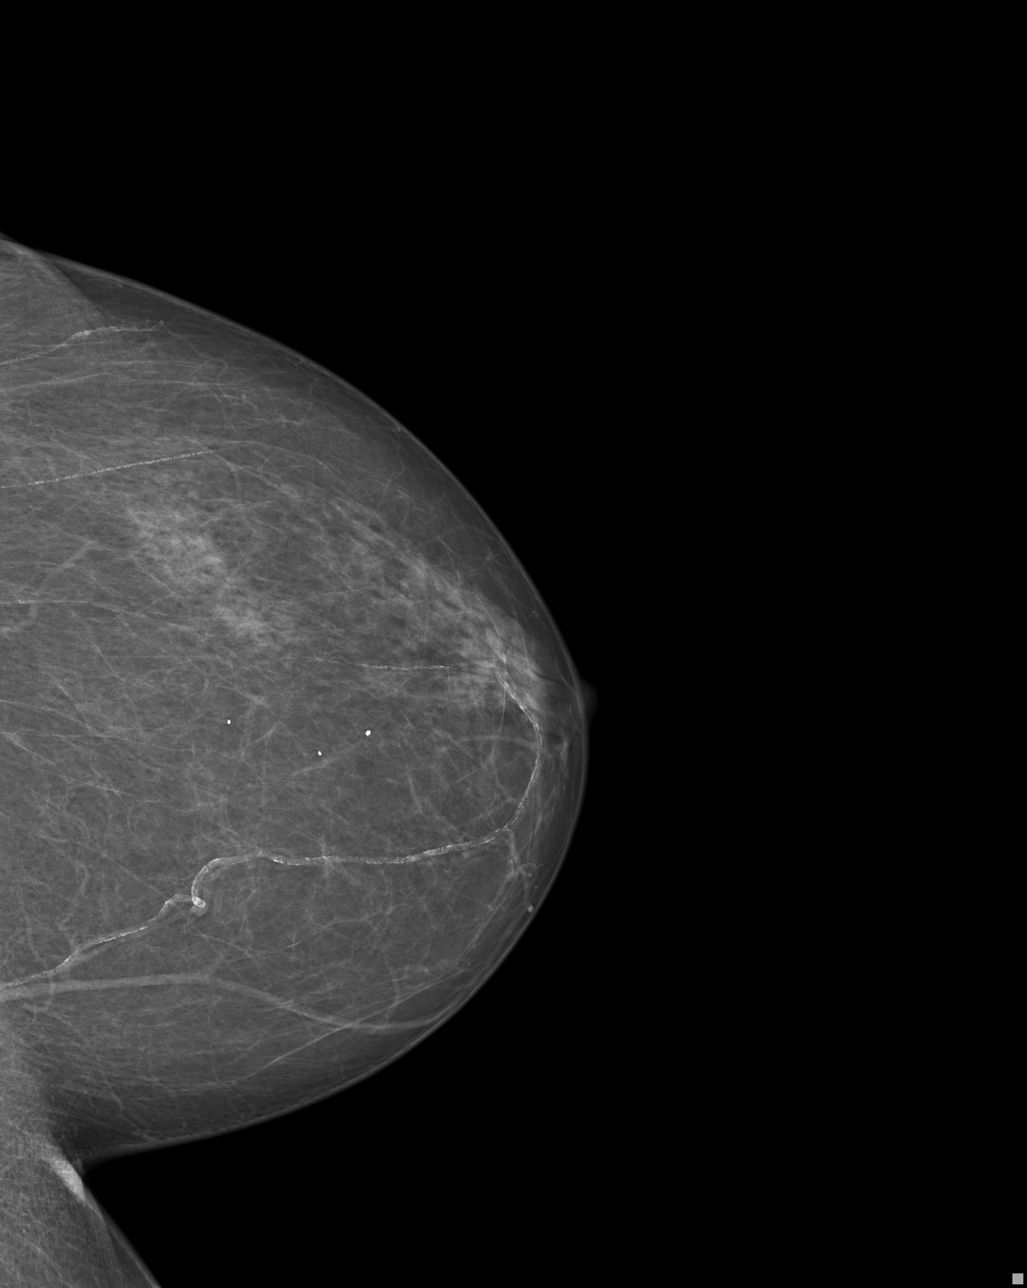
[im 3/4]
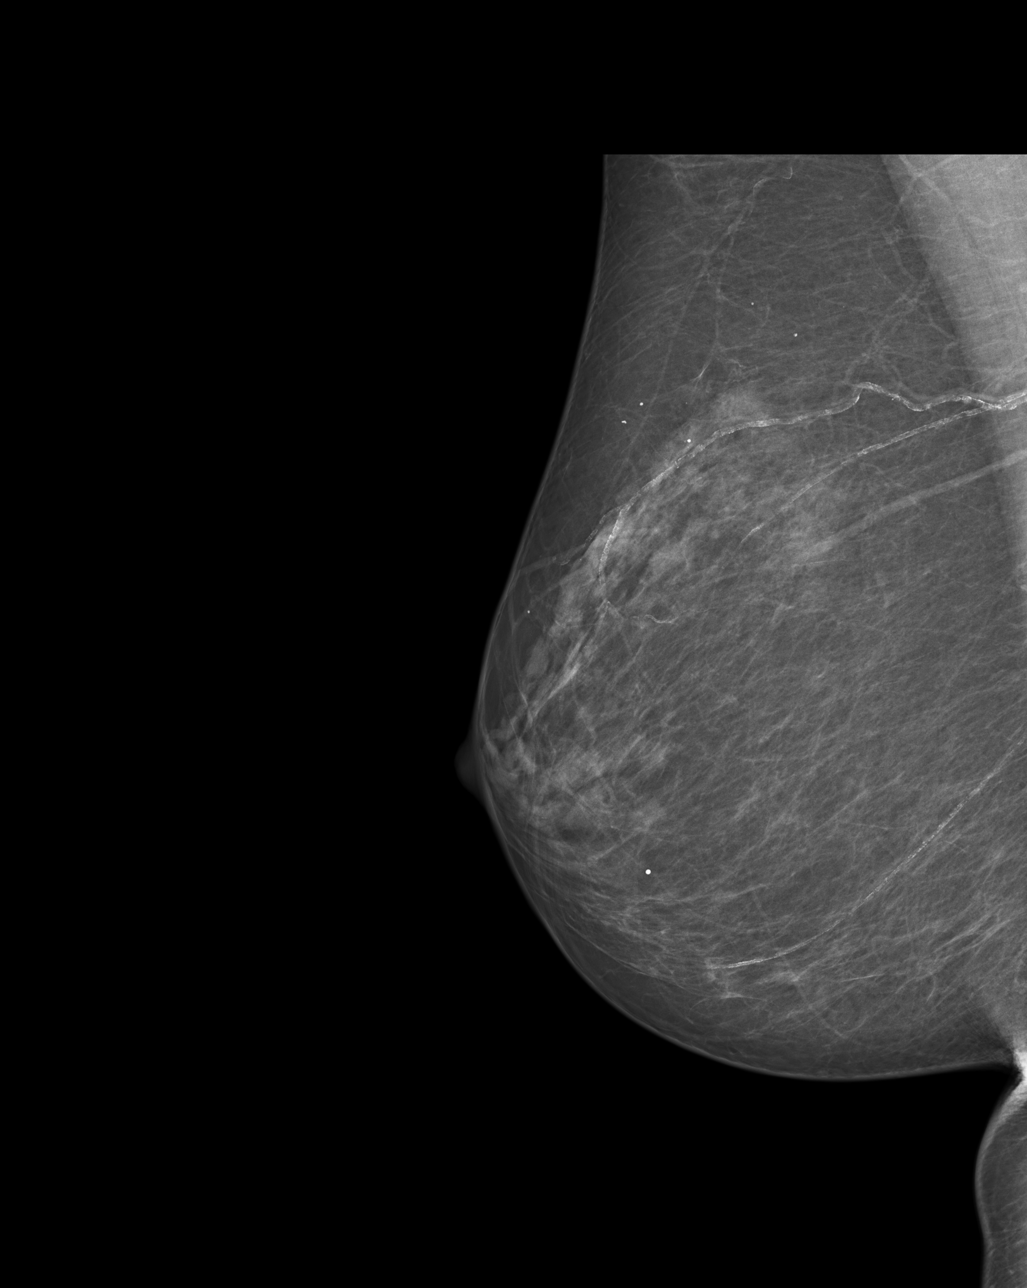
[im 4/4]
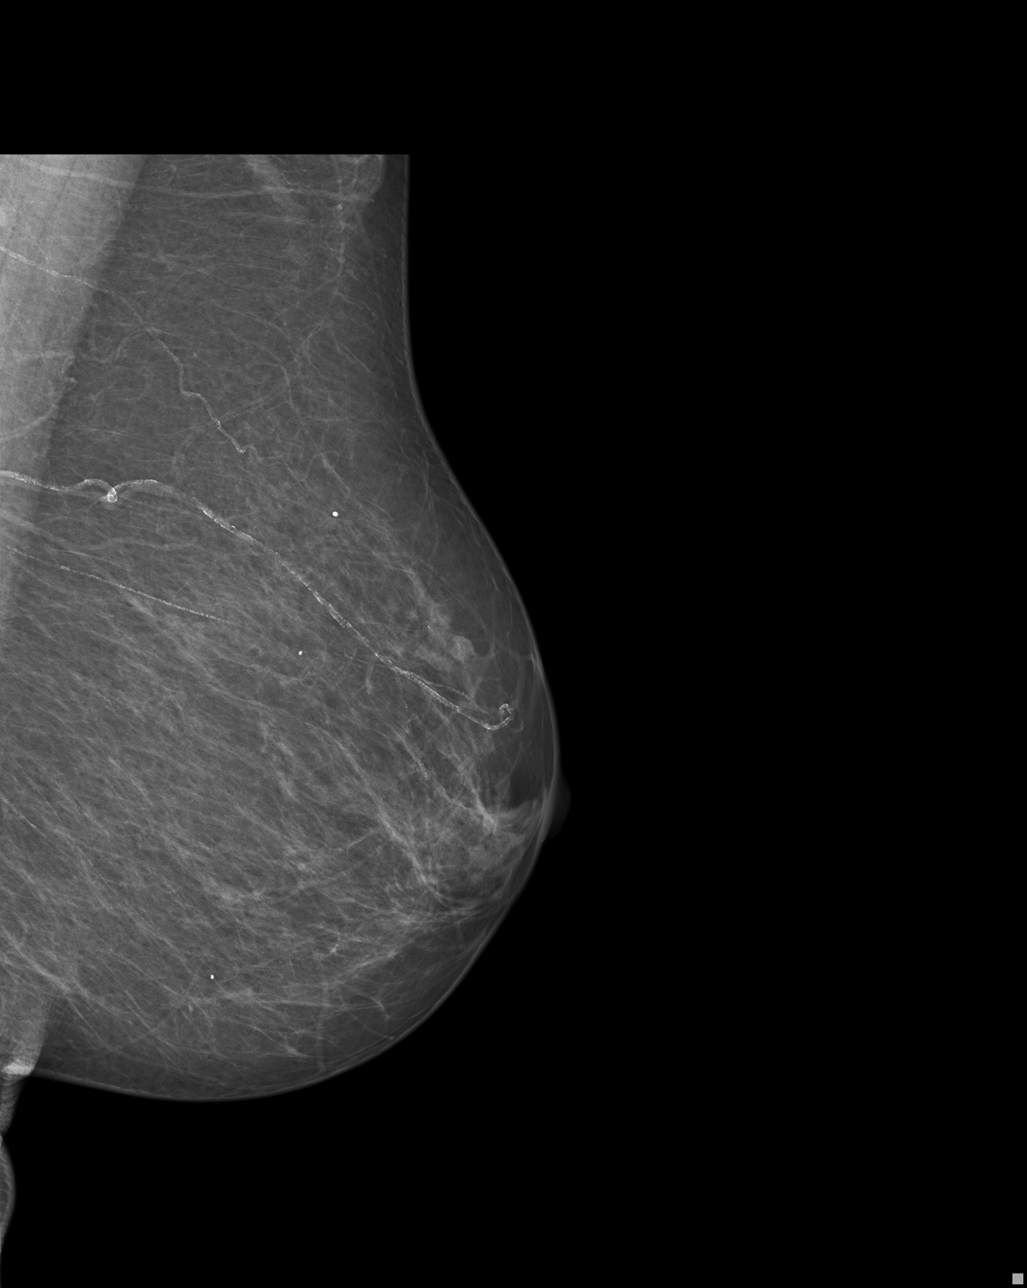

[4 of 4 positions shown; findings below may reference images not displayed]

EXAM
SCREENING MAMMOGRAM, BILATERAL

INDICATION
screening
M. AUNT DX @ 78. SCREENING. AB (CA-NO1IOD: 4104, 2803.

TECHNIQUE
Bilateral craniocaudal and medial lateral oblique views were generated and reviewed with computer-
aided detection.

COMPARISONS
March 07, 2017

FINDINGS
ACR Type 2: 25-50% There are scattered fibroglandular densities.
No concerning masses, calcifications, or interval changes are seen.

IMPRESSION
Stable bilateral mammography. One year follow-up is recommended.
BI-RADS 2, BENIGN.

Tech Notes:

## 2018-09-04 ENCOUNTER — Encounter: Admit: 2018-09-04 | Discharge: 2018-09-04 | Payer: MEDICARE

## 2018-09-04 ENCOUNTER — Ambulatory Visit: Admit: 2018-09-04 | Discharge: 2018-09-04 | Payer: MEDICARE

## 2018-09-04 DIAGNOSIS — E782 Mixed hyperlipidemia: ICD-10-CM

## 2018-09-04 DIAGNOSIS — I10 Essential (primary) hypertension: ICD-10-CM

## 2018-09-04 DIAGNOSIS — R5383 Other fatigue: ICD-10-CM

## 2018-09-04 DIAGNOSIS — I2510 Atherosclerotic heart disease of native coronary artery without angina pectoris: Principal | ICD-10-CM

## 2018-09-04 DIAGNOSIS — R0600 Dyspnea, unspecified: ICD-10-CM

## 2018-09-04 DIAGNOSIS — R011 Cardiac murmur, unspecified: Principal | ICD-10-CM

## 2018-09-04 DIAGNOSIS — R55 Syncope and collapse: ICD-10-CM

## 2018-09-04 DIAGNOSIS — E785 Hyperlipidemia, unspecified: ICD-10-CM

## 2018-09-04 DIAGNOSIS — I351 Nonrheumatic aortic (valve) insufficiency: ICD-10-CM

## 2018-09-04 MED ORDER — LISINOPRIL 20 MG PO TAB
20 mg | ORAL_TABLET | Freq: Every day | ORAL | 3 refills | Status: AC
Start: 2018-09-04 — End: 2019-06-08

## 2018-09-04 MED ORDER — ROSUVASTATIN 20 MG PO TAB
20 mg | ORAL_TABLET | Freq: Every day | ORAL | 3 refills | 90.00000 days | Status: AC
Start: 2018-09-04 — End: 2019-08-06

## 2018-09-04 NOTE — Progress Notes
Neurological: Negative.    Psychiatric/Behavioral: Negative.    Allergic/Immunologic: Negative.        Physical Exam  Nursing note and vitals reviewed.  Constitutional: She appears well-developed and well-nourished. No distress.   HENT:   Head: Normocephalic and atraumatic.   Nose: Nose normal.   Mouth/Throat: Oropharynx is clear and moist.   Eyes: Pupils are equal, round, and reactive to light. Conjunctivae and EOM are normal. No scleral icterus.   Neck: Normal range of motion. No JVD present. Carotid bruit is not present (bilaterally).   Cardiovascular: Normal rate, regular rhythm and intact distal pulses. Exam reveals no gallop and no friction rub.   Murmur heard.   Early systolic murmur is present with a grade of 2/6 at the upper right sternal border.  Pulmonary/Chest: Effort normal and breath sounds normal. No respiratory distress. She has no wheezes. She has no rales.   Abdominal: Soft. Bowel sounds are normal. She exhibits no distension. There is no tenderness. There is no guarding.   Musculoskeletal: Normal range of motion. She exhibits no edema or tenderness.   Lymphadenopathy:     She has no cervical adenopathy.   Neurological: She is alert and oriented to person, place, and time. No cranial nerve deficit. Coordination normal.   Skin: Skin is warm and dry. No rash noted. She is not diaphoretic. No erythema.   Psychiatric: She has a normal mood and affect. Her behavior is normal.   ???  ???  Cardiovascular Studies    Problems Addressed Today  Encounter Diagnoses   Name Primary?   ??? Coronary artery disease involving native coronary artery of native heart without angina pectoris    ??? Essential hypertension    ??? Mixed hyperlipidemia    ??? Nonrheumatic aortic valve insufficiency        Assessment and Plan     1. Coronary artery disease  ??? As described in detail above  ??? We will continue secondary preventative therapy with rosuvastatin 20 mg nightly and aspirin 81 mg daily    2. Essential hypertension

## 2018-09-04 NOTE — Patient Instructions
It was nice to see you in clinic today.    We discussed:  ??? your symptoms of occasional brief hot flashes.  I don't think this is cardiac related at this time  ??? your history of non-obstructive coronary artery plaquing/disease based on the cardiac CT scan.  This is not causing you trouble and we are managing with medications to prevent further progression  ??? your blood pressure looks excellent  ??? your cholesterol looked good in October    No medication changes today, please continue the same medications as you have been doing.    No new testing at this time.    I will plan to see you back in about 9 months.  Please call us in the meantime with any questions or concerns.    The Cardiovascular Medicine [facility] Team nurse (CJ Claudina Lick, or Willene Hatchet) number is [phone].  If you have not heard the results of your testing in more than 1 week after it has been performed, please give [place] a call so that we may investigate further.  If you need to schedule an appointment, the green team scheduling number is [phone].

## 2019-06-08 ENCOUNTER — Encounter: Admit: 2019-06-08 | Discharge: 2019-06-08 | Payer: MEDICARE

## 2019-06-08 DIAGNOSIS — I10 Essential (primary) hypertension: Secondary | ICD-10-CM

## 2019-06-08 MED ORDER — LISINOPRIL 20 MG PO TAB
20 mg | ORAL_TABLET | Freq: Every day | ORAL | 3 refills | Status: DC
Start: 2019-06-08 — End: 2020-01-04

## 2019-07-11 ENCOUNTER — Encounter: Admit: 2019-07-11 | Discharge: 2019-07-11 | Payer: MEDICARE

## 2019-07-11 DIAGNOSIS — R0600 Dyspnea, unspecified: Secondary | ICD-10-CM

## 2019-07-11 DIAGNOSIS — R55 Syncope and collapse: Secondary | ICD-10-CM

## 2019-07-11 DIAGNOSIS — I2510 Atherosclerotic heart disease of native coronary artery without angina pectoris: Secondary | ICD-10-CM

## 2019-07-11 DIAGNOSIS — I10 Essential (primary) hypertension: Secondary | ICD-10-CM

## 2019-07-11 DIAGNOSIS — R011 Cardiac murmur, unspecified: Secondary | ICD-10-CM

## 2019-07-11 DIAGNOSIS — E785 Hyperlipidemia, unspecified: Secondary | ICD-10-CM

## 2019-07-11 DIAGNOSIS — E7849 Other hyperlipidemia: Secondary | ICD-10-CM

## 2019-07-11 DIAGNOSIS — R5383 Other fatigue: Secondary | ICD-10-CM

## 2019-07-11 DIAGNOSIS — I351 Nonrheumatic aortic (valve) insufficiency: Secondary | ICD-10-CM

## 2019-07-11 NOTE — Progress Notes
Date of Service: 07/11/2019    Brianna Christensen is a 78 y.o. female.       HPI     This is a delightfully pleasant 78 year old female whom is seen at the department of cardiovascular medicine clinic at the [place], [place] office today for ongoing cardiovascular care. ?She was initially referred for 2 episodes of presyncope. ?In hindsight, it sounds like these were actually more of a sensation of vertigo or disequilibrium rather than presyncope. ?She does have known hypertension and dyslipidemia. ?She has never been a smoker. 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  She is also had some nonspecific fatigue in the past, which has since resolved.  We had taken her off carvedilol for this reason, at least to avoid any confounding variables.    She returns the office today with no acute cardiovascular complaints.  She has been doing well overall.  She has been trying to stay safe with the ongoing coronavirus pandemic.  She has been socially distancing.  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  There is been some changes with the logistics of the process, but it sounds like they have adapted well.  She denies any chest pain.  No significant dyspnea with exertion.  She denies orthopnea, PND, or lower extreme edema.  She denies palpitations or presyncope.  She remains compliant with her medical therapy.  She does report that she had a recent annual physical examination with her PCP in October.  She had lab work performed at that time, which we will work on obtaining a copy. Most recent cardiovascular testing includes a resting echo Doppler study from June 2019.  LVEF was 60%.  Diastolic function was indeterminate.  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  There was mild to moderate aortic regurgitation.  Chamber sizes were within normal limits.  The estimated pulmonary artery systolic pressure was approximately 24 mmHg.  Her most recent ischemic evaluation was by a Bruce treadmill thallium myocardial perfusion study on March 18, 2017. ?There was felt to be a mild intensity defect in the anterior wall. ?It was somewhat indeterminate. ?The calculated ejection fraction was 66% with no regional wall motion or thickening abnormalities. ?Given the somewhat equivocal nature of the findings, we pursued a coronary CTA, which was performed on May 04, 2017. ?This study demonstrated mild to moderate areas of mixed plaquing within the LAD, diagonal, and distal RCA. ?This plaquing was assessed by noninvasive fractional flow reserve, and noted to be not hemodynamically significant.         Vitals:    07/11/19 0851 07/11/19 0857   BP: 120/78 122/78   BP Source: Arm, Left Upper Arm, Right Upper   Patient Position: Sitting Sitting   Pulse: 76    Temp: 36.9 ?C (98.5 ?F)    TempSrc: Oral    SpO2: 98%    Weight: 83.1 kg (183 lb 3.2 oz)    Height: 1.689 m (5' 6.5)    PainSc: Zero      Body mass index is 29.13 kg/m?Marland Kitchen     Past Medical History  Patient Active Problem List    Diagnosis Date Noted   ? 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 CAD (coronary artery disease) 05/18/2017     05/03/17 - CCTA  at [facility] - Mild to moderate areas of mixed plaquing noted. Though FFR assessment suggested no significant obstructive coronary artery disease. 03/18/17 - MPI Stress Test at [facility] -  1. This study is probably normal with mild intensity attenuation in the anterior wall.  All segments are viable global left ventricular function is within normal limits other high risk indicators are not noted.  The patient has poor exercise capacity which placed the patient intermediate risk category but in light of the attenuation artifact anteriorly no definite regional perfusion defects are noted.  If the clinical suspicion of coronary disease is intermediate consider coronary CTA with FFR.       ? Essential hypertension 03/07/2017     Echo - 01/09/18 at [facility] - 1. Normal LV size with concentric remodeling and normal systolic function, EF ~ 60%;  No regional wall motion abnormalities. 2. Diastolic function is technically indeterminate.Normal size atria bilaterally 3. Aortic sclerosis without stenosis.  Mild to moderate regurgitation; 4. Visualized portion of the aortic root is within normal limits according to patient's body surface area. 5. Estimated peak systolic PA pressure =  24 mmHg; 6. No pericardial effusion  ?     ? Fatigue 03/04/2017   ? Hyperlipidemia 03/04/2017     03/18/17 - MPI Stress Test at [facility] - 1. This study is probably normal with mild intensity attenuation in the anterior wall.  All segments are viable global left ventricular function is within normal limits other high risk indicators are not noted.  The patient has poor exercise capacity which placed the patient intermediate risk category but in light of the attenuation artifact anteriorly no definite regional perfusion defects are noted.  If the clinical suspicion of coronary disease is intermediate consider coronary CTA with FFR.     ? Near syncope 03/04/2017   ? Murmur 03/03/2017 02/25/2017 Echo at [facility]  Mitral valve leaflets appear mildly thickened.  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  The mitral regurgitation jet was central. 2.  Trileaflet aortic valve.  The aortic cusps appear mildly thickened.  Aortic cusps appear mildly calcified.  The peak transaortic gradient was 17.00 mmHg.  The mean transaortic gradient was 7.00 mmHg.  Moderate aortic valve regurgitation.  Normal LV systolic function.  LV EF 62%.  Normal left ventricular wall thickness.            Review of Systems   HENT: Negative.    Eyes: Negative.    Cardiovascular: Negative.    Respiratory: Negative.    Endocrine: Negative.    Hematologic/Lymphatic: Negative.    Gastrointestinal: Negative.    Genitourinary: Negative.    Neurological: Negative.    Psychiatric/Behavioral: Negative.    Allergic/Immunologic: Negative.        Physical Exam  Nursing note and vitals reviewed.  Constitutional: She appears well-developed and well-nourished. No distress.   HENT:   Head: Normocephalic and atraumatic.   Nose: Nose normal.   Mouth/Throat: Oropharynx is clear and moist.   Eyes: Pupils are equal, round, and reactive to light. Conjunctivae and EOM are normal. No scleral icterus.   Neck: Normal range of motion. No JVD present. Carotid bruit is not present (bilaterally).   Cardiovascular: Normal rate, regular rhythm and intact distal pulses. Exam reveals no gallop and no friction rub.   Murmur heard.   Early systolic murmur is present with a grade of 2/6 at the upper right sternal border.  Pulmonary/Chest: Effort normal and breath sounds normal. No respiratory distress. She has no wheezes. She has no rales.   Abdominal: Soft. Bowel sounds are normal. She exhibits no distension. There is no tenderness. There is no guarding.   Musculoskeletal: Normal range of  motion. She exhibits no edema or tenderness.   Lymphadenopathy:     She has no cervical adenopathy. Neurological: She is alert and oriented to person, place, and time. No cranial nerve deficit. Coordination normal.   Skin: Skin is warm and dry. No rash noted. She is not diaphoretic. No erythema.   Psychiatric: She has a normal mood and affect. Her behavior is normal.   ?  ?  Cardiovascular Studies    Problems Addressed Today  Encounter Diagnoses   Name Primary?   ? Coronary artery disease involving native coronary artery of native heart without angina pectoris Yes   ? Essential hypertension    ? Other hyperlipidemia    ? Nonrheumatic aortic valve insufficiency        Assessment and Plan     1. Coronary artery disease  ? As described in detail above  ? We will continue secondary preventative therapy with rosuvastatin 20 mg nightly and aspirin 81 mg daily    2. Essential hypertension  ? Her blood pressure is under excellent control in the office today on current monotherapy with lisinopril 20 mg daily    3. Dyslipidemia  ? Her most recent fasting lipid profile was performed in October 2020.  We do not have a copy at this time.  ? I would like her to continue her rosuvastatin 20 mg nightly    4. Valvular heart disease  ? 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  She has mild to moderate regurgitation.  ? We will plan repeat resting echo Doppler study in June 2021, which will be 2 years since her previous examination.  ? We did discuss concerning signs or symptoms such as increasing dyspnea with exertion, orthopnea, volume overload, etc.  ? She does not require antibiotic prophylaxis at this time    Patient's questions were answered and they agreed with the above plan.  Specific instructions were typed into their After Visit Summary document.  Follow-up in 6 months or sooner as needed.  Thank you for the opportunity to participate in the care of your patient.  Please call with questions or concerns.    Gloris Ham, MD, [facility]  Department of Cardiovascular Medicine  [facility] Current Medications (including today's revisions)  ? acetaminophen (TYLENOL) 325 mg tablet Take 650 mg by mouth daily.   ? ascorbic acid (VITAMIN C) 500 mg tablet Take 500 mg by mouth daily.   ? aspirin 81 mg chewable tablet Chew 81 mg by mouth daily. Take with food.   ? Calcium Carbonate 600 mg calcium (1,500 mg) tab Take 1 tablet by mouth twice daily.   ? lisinopriL (ZESTRIL) 20 mg tablet Take one tablet by mouth daily.   ? MULTIVITAMIN PO Take 1 tablet by mouth daily.   ? rosuvastatin (CRESTOR) 20 mg tablet Take one tablet by mouth daily.   ? Vit A,C & E-Lutein-Minerals 1,000 unit-200 mg-60 unit-2 mg tab Take 1 tablet by mouth twice daily.

## 2019-07-11 NOTE — Patient Instructions
It was nice to see you in clinic today.    We discussed:   your history of coronary artery disease based on CT scan in 2018.  This was not to the degree that it was compromising the blood flow to the heart.  We will continue on medications to prevent further progression.   your blood pressure looked great in the office today   you had recent lab work at your primary doctor, we will try to get a copy of the cholesterol panel   your history of mild to moderate leakiness of the aortic valve    No medication changes today, please continue the same medications as you have been doing.    We will be pursuing the following tests after your appointment today:  ? echo/Doppler (ultrasound of your heart looking at the structure, function, and valves)    I will plan to see you back in about 6 months.  Please call us in the meantime with any questions or concerns.    The Cardiovascular Medicine "[facility] Team" nurse Marylyn Ishihara Minerva Ends Akright, or Viviano Simas) number is [phone].  If you have not heard the results of your testing in more than 1 week after it has been performed, please give [place] a call so that we may investigate further.  If you need to schedule an appointment, the "green team" scheduling number is [phone].

## 2019-07-26 IMAGING — MG MAMMOGRAM, DIGITAL SCREEN BILA
1 series · 4 of 4 positions shown · non-contrast
Comparison: none

[Series 2: R CC · right · 4 of 4 slices shown]
[im 1/4]
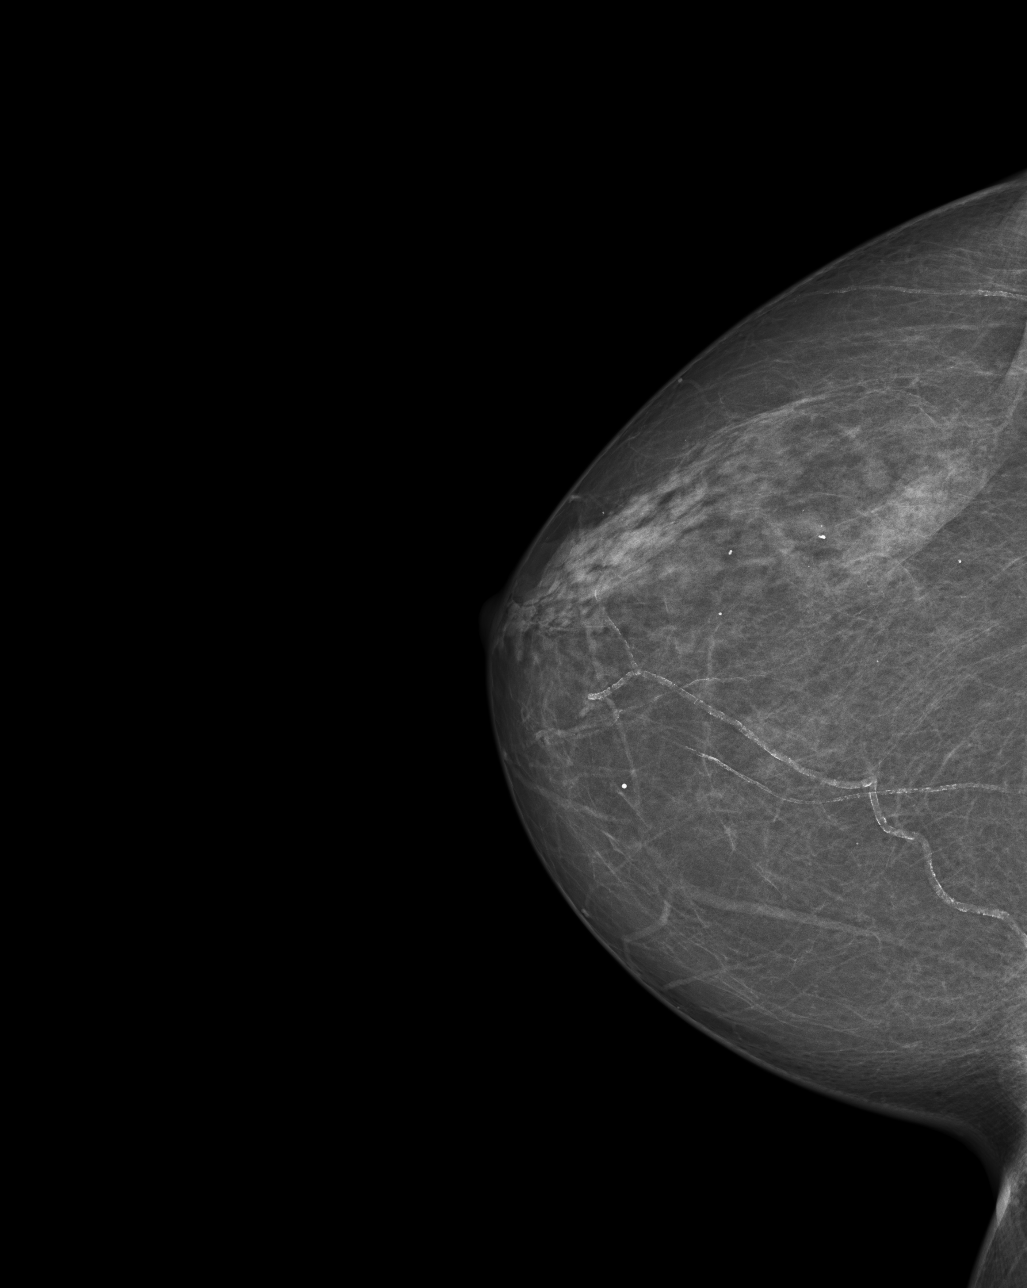
[im 2/4]
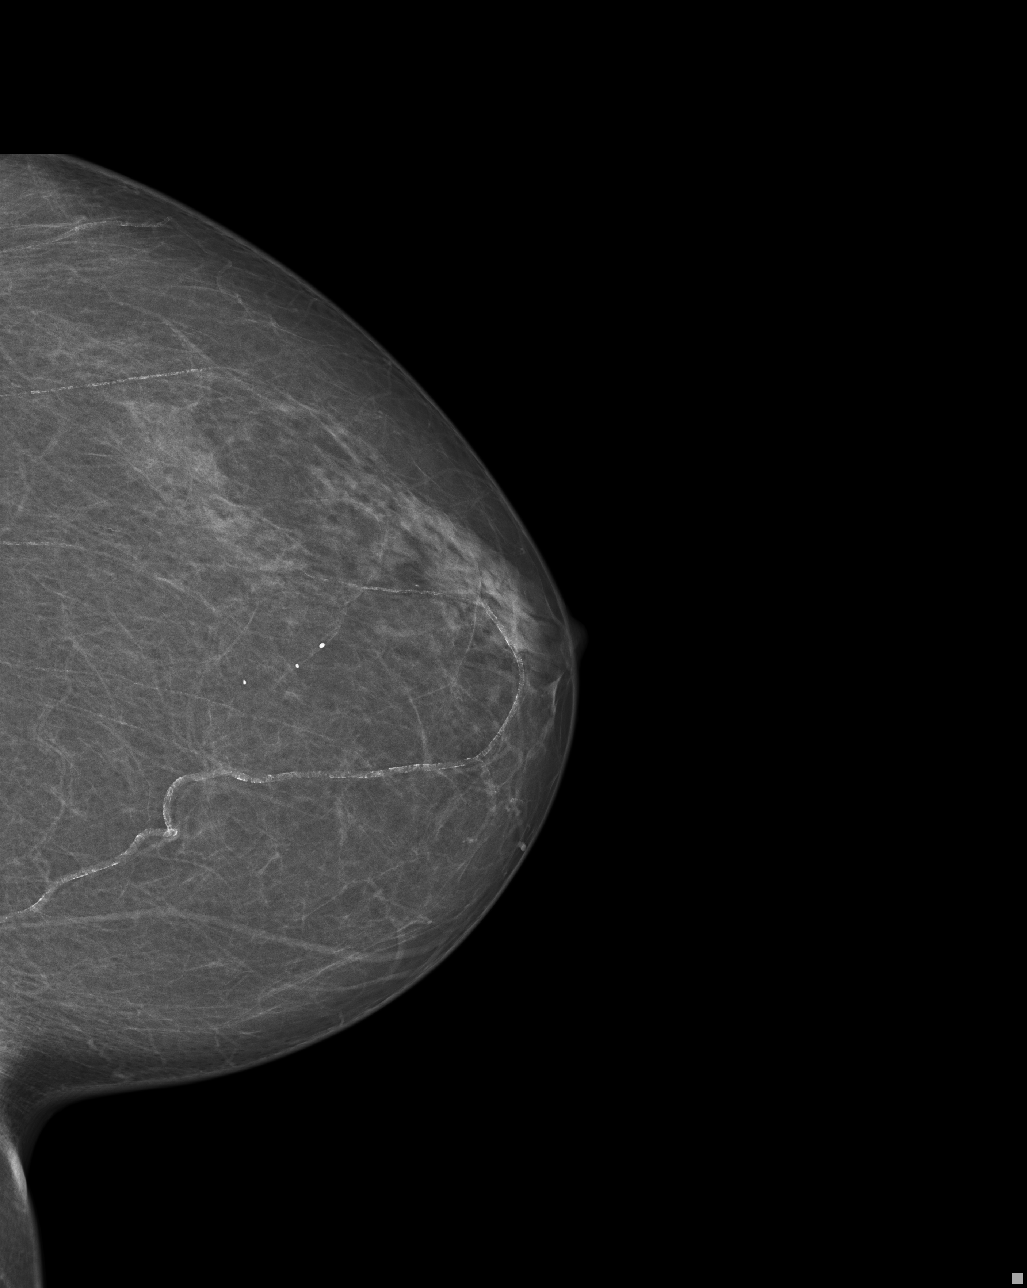
[im 3/4]
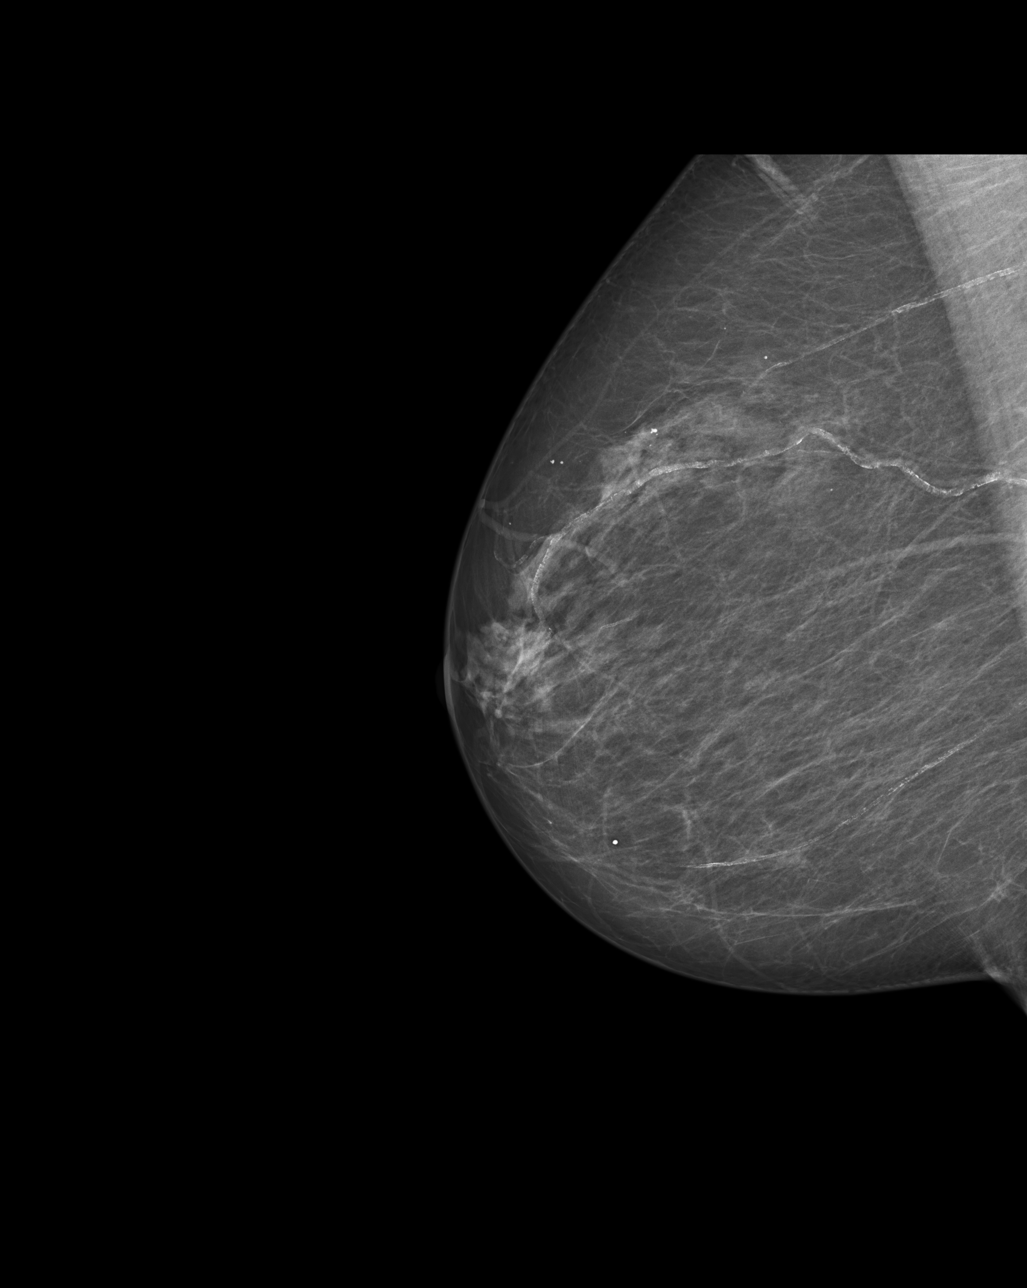
[im 4/4]
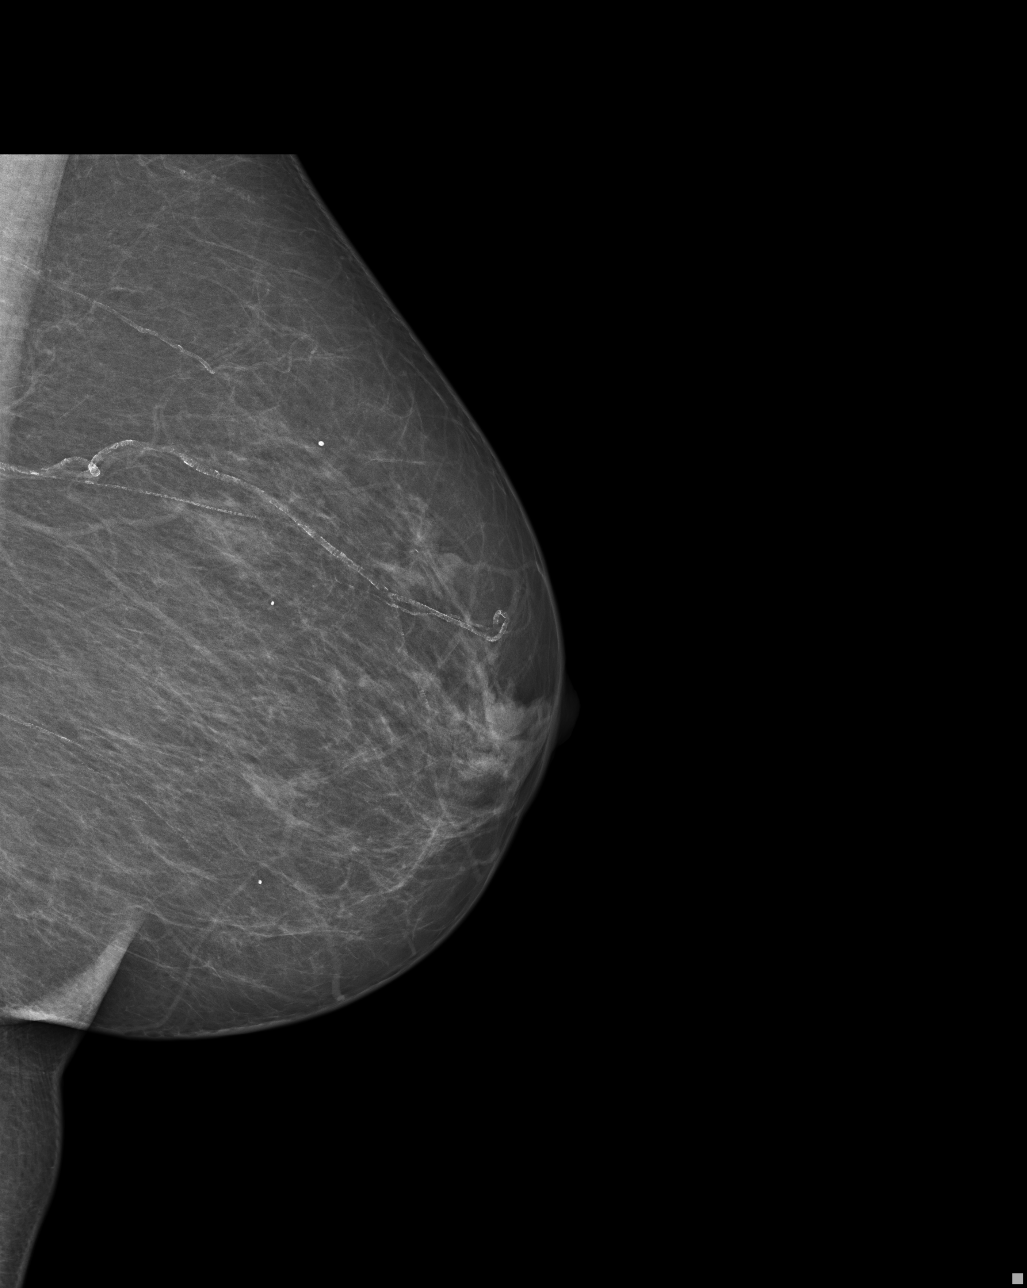

[4 of 4 positions shown; findings below may reference images not displayed]

EXAM

SCREENING MAMMOGRAM, BILATERAL

INDICATION

screening
MATERNAL AUNT DX @ 78.  SCREENING.  LM (2D) PRIORS: [id number].

TECHNIQUE

Bilateral craniocaudal and medial lateral oblique views were generated and reviewed with computer-
aided detection.

COMPARISONS

June 12, 2018,March 07, 2017

FINDINGS

Scattered breast parenchyma. Scattered benign type calcifications. No spiculated mass.

IMPRESSION

BI-RADS 2. Benign findings. Continue annual screening mammgography in 12 months.

Tech Notes:

## 2019-08-06 ENCOUNTER — Encounter: Admit: 2019-08-06 | Discharge: 2019-08-06 | Payer: MEDICARE

## 2019-08-06 DIAGNOSIS — E782 Mixed hyperlipidemia: Secondary | ICD-10-CM

## 2019-08-06 DIAGNOSIS — I2510 Atherosclerotic heart disease of native coronary artery without angina pectoris: Secondary | ICD-10-CM

## 2019-08-06 MED ORDER — ROSUVASTATIN 20 MG PO TAB
20 mg | ORAL_TABLET | Freq: Every day | ORAL | 3 refills | 90.00000 days | Status: AC
Start: 2019-08-06 — End: ?

## 2019-10-08 IMAGING — CR LOW_EXM
3 series · 3 of 3 positions shown · non-contrast
Comparison: none

[foot]
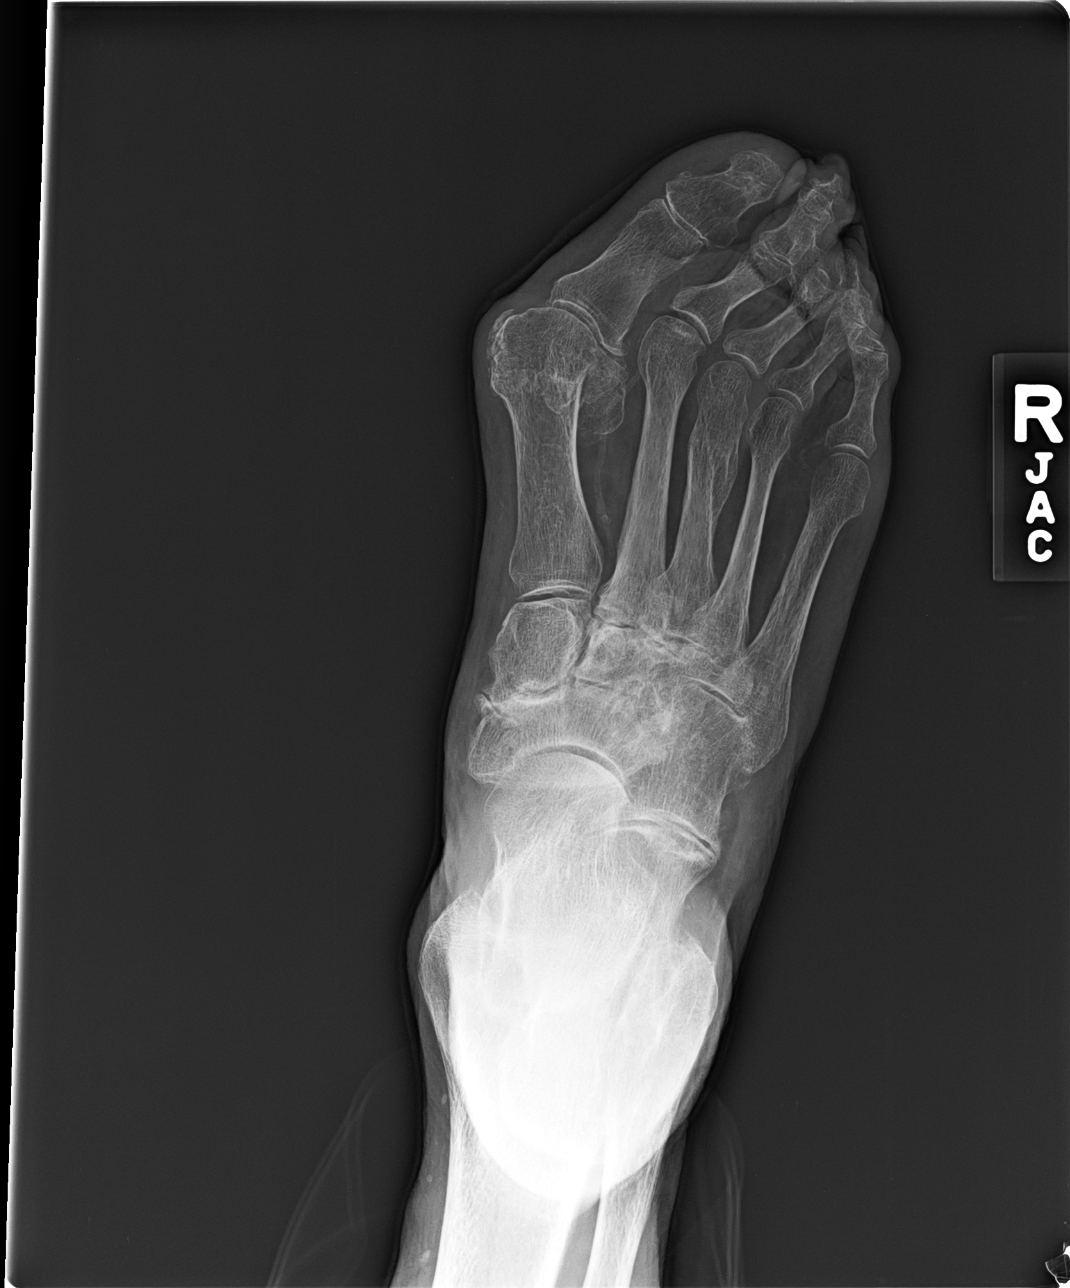

[foot obl]
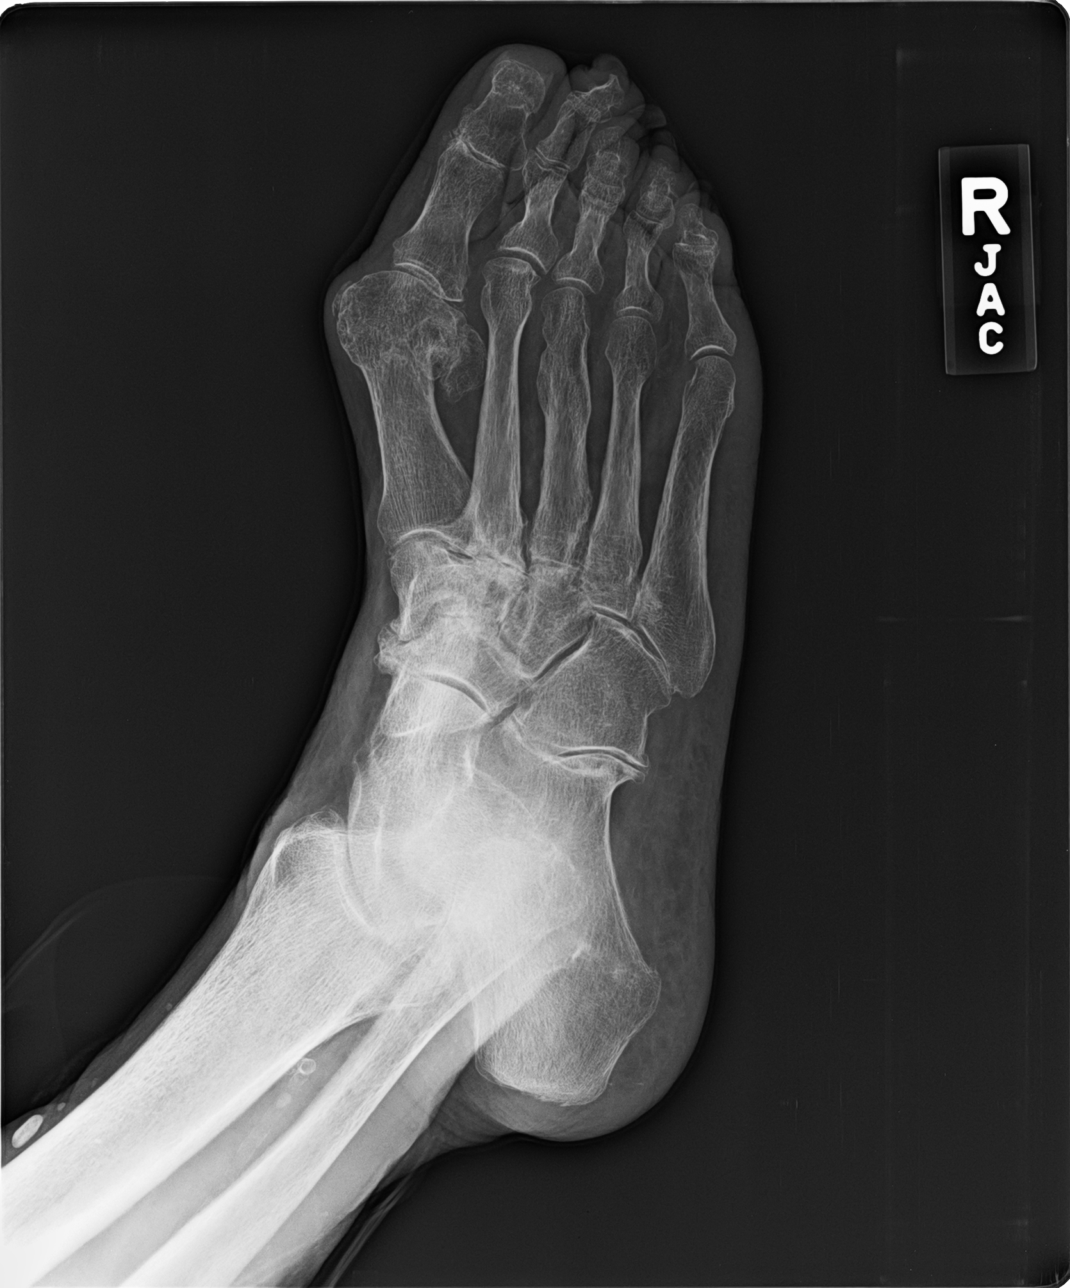

[foot lat]
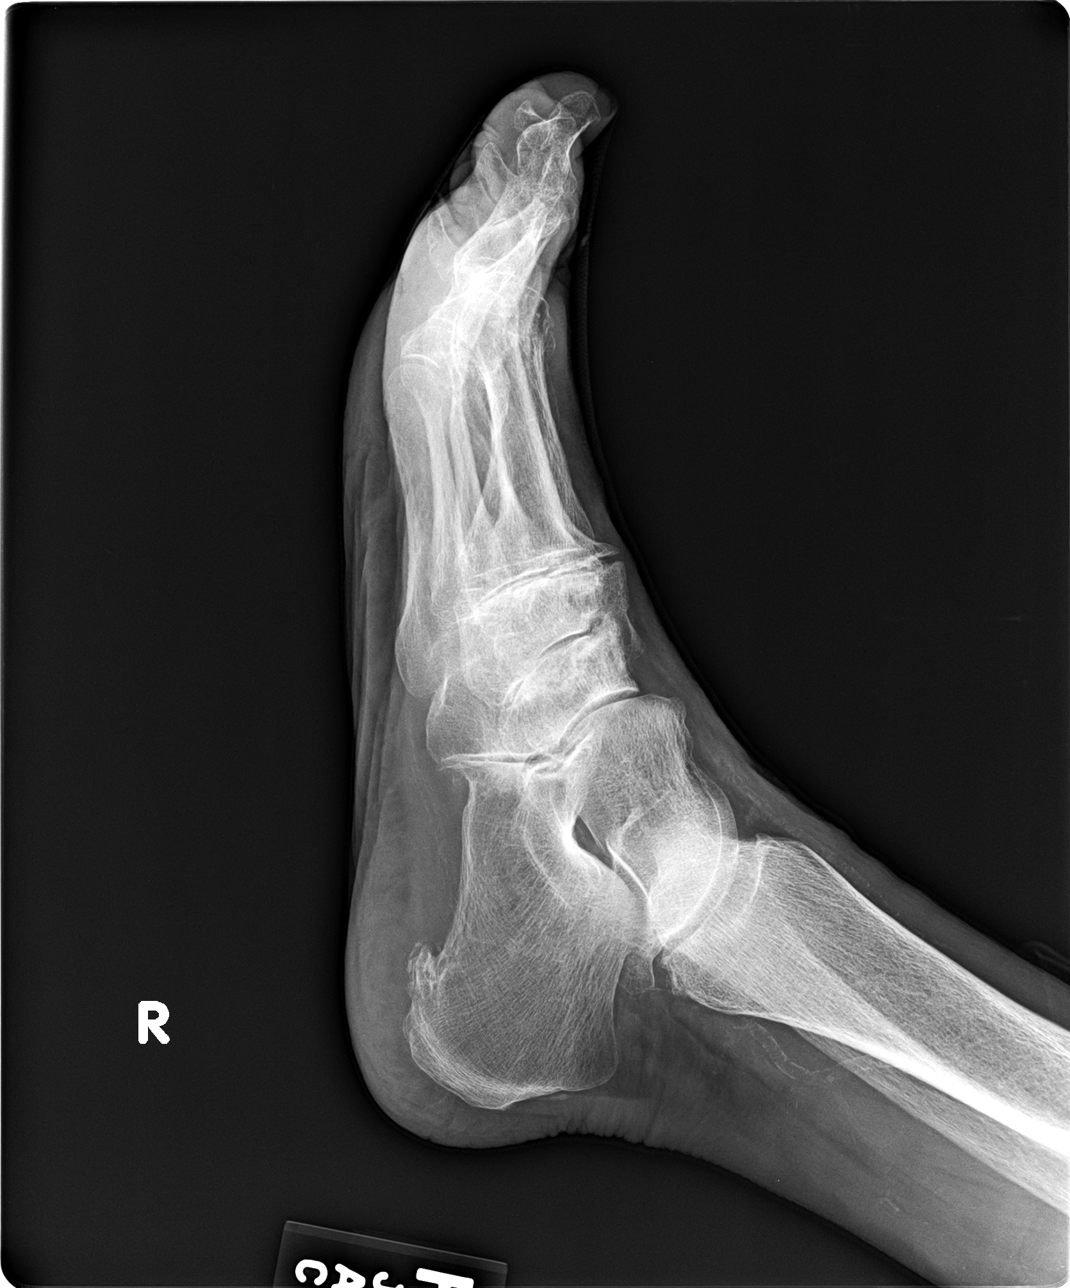

[3 of 3 positions shown; findings below may reference images not displayed]

DIAGNOSTIC STUDIES

EXAM

XR foot RT min 3V

INDICATION

foot pain
Pt states chronic pain in LT hip RT foot. PT states no known injury. PT states pain w/ WB on LT
hip. JC

TECHNIQUE

AP lateral and oblique views of the right foot were obtained.

COMPARISONS

None available

FINDINGS

Hallux valgus deformity and bunion formation is noted. Diffuse demineralization is evident. Old
fracture of the 3rd meta tarsal is noted. Advanced degenerative changes of the mid tarsal joint
space and tarsometatarsal joint spaces are noted. Small vessel calcification is seen. There is
plantar calcaneal spurring.

IMPRESSION

Severe degenerative changes with hallux valgus deformity and bunion formation. No fractures are
evident.

Tech Notes:

Pt states chronic pain in LT hip   RT foot. PT states no known injury. PT states pain w/ WB on LT
hip. JC

## 2019-10-08 IMAGING — CR PELVIS
3 series · 3 of 3 positions shown · non-contrast
Comparison: none

[pelvis]
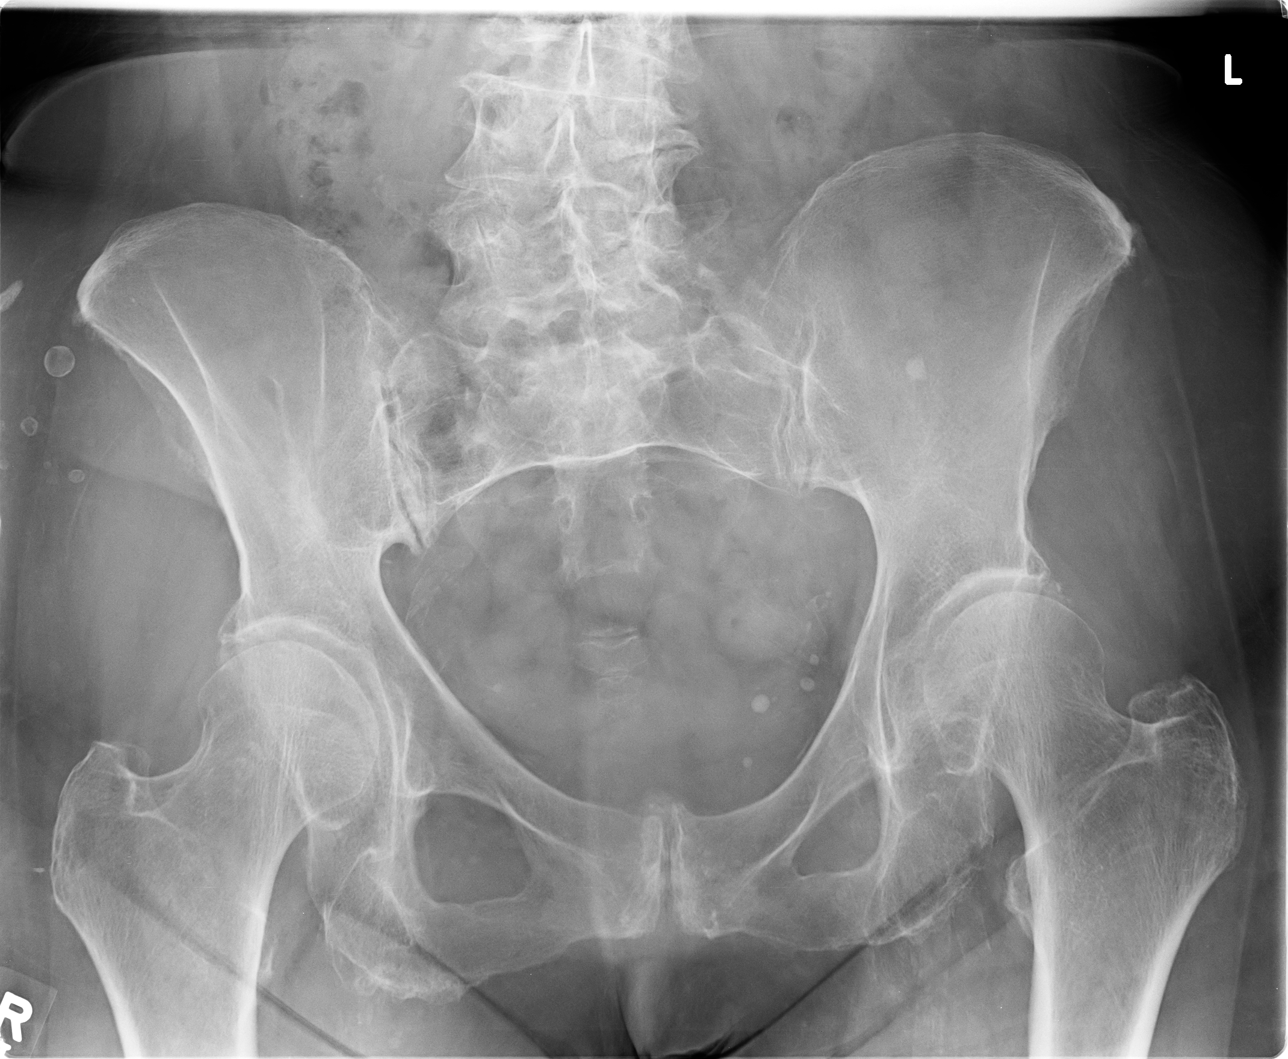

[hip ap]
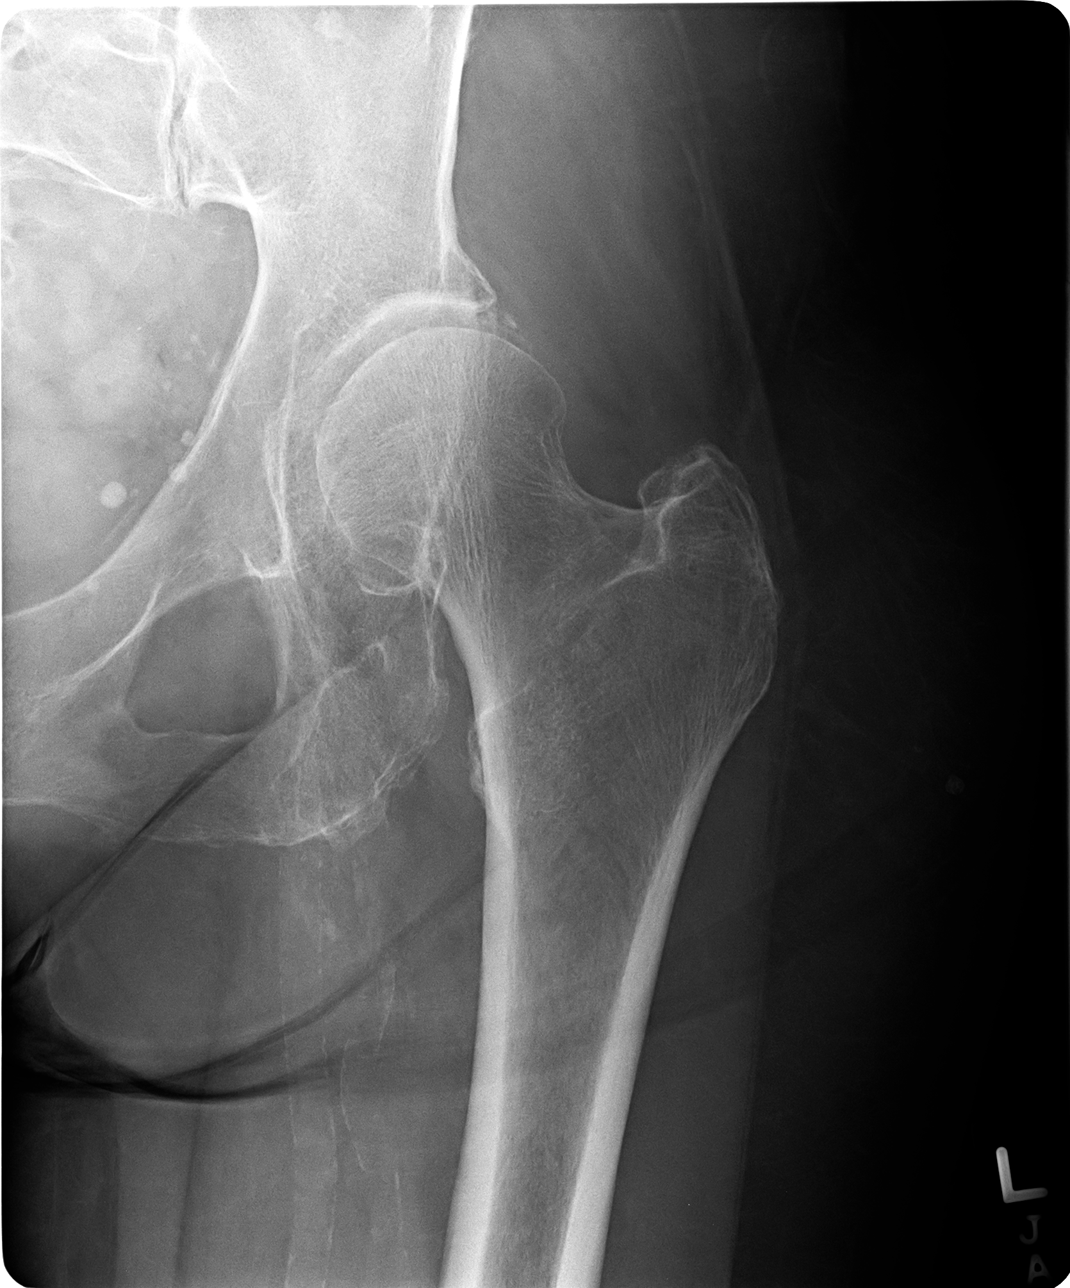

[hip frog]
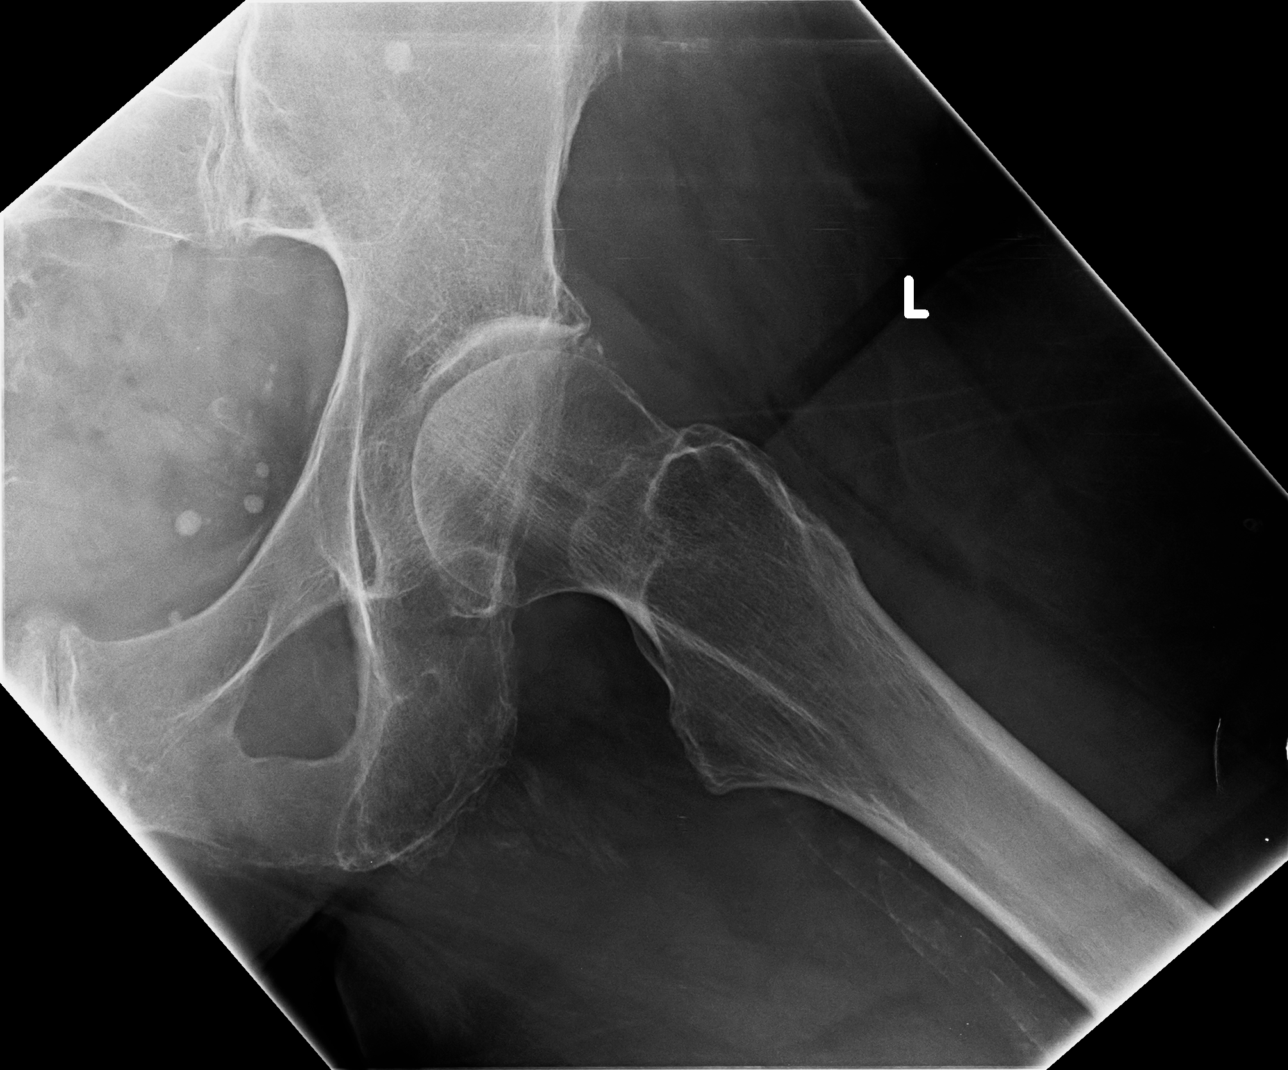

[3 of 3 positions shown; findings below may reference images not displayed]

DIAGNOSTIC STUDIES

EXAM

XR hip LT, 2-3V w or wo pelvis

INDICATION

L hip pain
Pt states chronic pain in LT hip RT foot. PT states no known injury. PT states pain w/ WB on LT
hip. JC

TECHNIQUE

AP pelvis, AP left hip and lateral views left hip were obtained.

COMPARISONS

October 18, 2017

FINDINGS

Degenerative changes lumbar spine and sacroiliac joints and symphysis pubis are noted and
unchanged. Calcifications throughout the pelvis are felt to be vascular. No fractures or
dislocations of the left hip are seen. There is mild loss of articular cartilage.

IMPRESSION

Degenerative changes throughout the pelvis and left hip. No fractures are evident.

Tech Notes:

Pt states chronic pain in LT hip   RT foot. PT states no known injury. PT states pain w/ WB on LT
hip. JC

## 2020-01-02 ENCOUNTER — Encounter: Admit: 2020-01-02 | Discharge: 2020-01-02 | Payer: MEDICARE

## 2020-01-04 ENCOUNTER — Encounter: Admit: 2020-01-04 | Discharge: 2020-01-04 | Payer: MEDICARE

## 2020-01-04 NOTE — Telephone Encounter
This encounter was created in error. Please disregard. 

## 2020-01-07 ENCOUNTER — Ambulatory Visit: Admit: 2020-01-07 | Discharge: 2020-01-07 | Payer: MEDICARE

## 2020-01-07 ENCOUNTER — Encounter: Admit: 2020-01-07 | Discharge: 2020-01-07 | Payer: MEDICARE

## 2020-01-07 DIAGNOSIS — R5383 Other fatigue: Secondary | ICD-10-CM

## 2020-01-07 DIAGNOSIS — I351 Nonrheumatic aortic (valve) insufficiency: Secondary | ICD-10-CM

## 2020-01-07 DIAGNOSIS — E7849 Other hyperlipidemia: Secondary | ICD-10-CM

## 2020-01-07 DIAGNOSIS — I2510 Atherosclerotic heart disease of native coronary artery without angina pectoris: Secondary | ICD-10-CM

## 2020-01-07 DIAGNOSIS — R0600 Dyspnea, unspecified: Secondary | ICD-10-CM

## 2020-01-07 DIAGNOSIS — E785 Hyperlipidemia, unspecified: Secondary | ICD-10-CM

## 2020-01-07 DIAGNOSIS — I10 Essential (primary) hypertension: Secondary | ICD-10-CM

## 2020-01-07 DIAGNOSIS — R55 Syncope and collapse: Secondary | ICD-10-CM

## 2020-01-07 DIAGNOSIS — R011 Cardiac murmur, unspecified: Secondary | ICD-10-CM

## 2020-01-07 NOTE — Progress Notes
Date of Service: 01/07/2020    Brianna Christensen is a 78 y.o. female.       HPI     This is a delightfully pleasant 79 year old female whom is seen at the department of cardiovascular medicine clinic at the [place], [place] office today for ongoing cardiovascular care. ?She was initially referred for 2 episodes of presyncope. ?In hindsight, it sounds like these were actually more of a sensation of vertigo or disequilibrium rather than presyncope. ?She does have known hypertension and dyslipidemia. ?She has never been a smoker. 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  She is also had some nonspecific fatigue in the past, which has since resolved.  We had taken her off carvedilol for this reason, at least to avoid any confounding variables.    She returns to the office today to follow-up on a recent emergency department visit at the [place].  She also had a planned echo Doppler study prior to our visit, which will be discussed in detail below.  She was seen in the [place], [place] emergency department on December 27, 2019.  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  She felt dizzy and very short of breath.  Sounds like she had not had much to eat or drink over noontime that day.  She was found to be hypertensive with blood pressure of 187/94.  She was given some IV fluids and observed.  Her labs were overall unremarkable with a normal CBC, CMP, and troponin assay.  I do not see that a TSH was checked.  Chest x-ray showed no acute cardiopulmonary abnormalities.  Twelve-lead ECG showed sinus rhythm with borderline criteria for LVH.  She was discharged with plans to follow-up here.    She has no acute cardiovascular complaint today, however her blood pressure in clinic is approximately 200/100 mmHg.  Her pulse is in the 80s.  She denies any chest pain.  She denies headache or visual changes.  No significant dyspnea.  No presyncope.  She denies orthopnea, PND, or any lower extreme edema.    She underwent an echo Doppler study today in the office, and I reviewed it with her.  Of note her blood pressure was a similar level.  LVEF was within normal limits.  There were no regional wall motion abnormalities.  She had aortic sclerosis with mild to moderate aortic insufficiency.  She had normal right ventricle size and systolic function.  There was a grade 1 diastolic dysfunction.  There was aortic sclerosis with mild to moderate aortic insufficiency.  Overall this was unchanged compared to a prior study in June 2019.  Her most recent ischemic evaluation was by a Bruce treadmill thallium myocardial perfusion study on March 18, 2017. ?There was felt to be a mild intensity defect in the anterior wall. ?It was somewhat indeterminate. ?The calculated ejection fraction was 66% with no regional wall motion or thickening abnormalities. ?Given the somewhat equivocal nature of the findings, we pursued a coronary CTA, which was performed on May 04, 2017. ?This study demonstrated mild to moderate areas of mixed plaquing within the LAD, diagonal, and distal RCA. ?This plaquing was assessed by noninvasive fractional flow reserve, and noted to be not hemodynamically significant.         Vitals:    01/07/20 0953 01/07/20 1002   BP: (!) 198/96 (!) 200/94   BP Source: Arm, Left Upper Arm, Right Upper   Patient Position: Sitting Sitting  Pulse: 82    SpO2: 96%    Weight: 82.6 kg (182 lb)    Height: 1.689 m (5' 6.5)    PainSc: Zero      Body mass index is 28.94 kg/m?Marland Kitchen     Past Medical History  Patient Active Problem List    Diagnosis Date Noted   ? 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 CAD (coronary artery disease) 05/18/2017     05/03/17 - CCTA  at [facility] - Mild to moderate areas of mixed plaquing noted. Though FFR assessment suggested no significant obstructive coronary artery disease.  03/18/17 - MPI Stress Test at [facility] - 1. This study is probably normal with mild intensity attenuation in the anterior wall.  All segments are viable global left ventricular function is within normal limits other high risk indicators are not noted.  The patient has poor exercise capacity which placed the patient intermediate risk category but in light of the attenuation artifact anteriorly no definite regional perfusion defects are noted.  If the clinical suspicion of coronary disease is intermediate consider coronary CTA with FFR.       ? Essential hypertension 03/07/2017     Echo - 01/09/18 at [facility] - 1. Normal LV size with concentric remodeling and normal systolic function, EF ~ 60%;  No regional wall motion abnormalities. 2. Diastolic function is technically indeterminate.Normal size atria bilaterally 3. Aortic sclerosis without stenosis.  Mild to moderate regurgitation; 4. Visualized portion of the aortic root is within normal limits according to patient's body surface area. 5. Estimated peak systolic PA pressure =  24 mmHg; 6. No pericardial effusion  ?     ? Fatigue 03/04/2017   ? Hyperlipidemia 03/04/2017     03/18/17 - MPI Stress Test at [facility] - 1. This study is probably normal with mild intensity attenuation in the anterior wall.  All segments are viable global left ventricular function is within normal limits other high risk indicators are not noted.  The patient has poor exercise capacity which placed the patient intermediate risk category but in light of the attenuation artifact anteriorly no definite regional perfusion defects are noted.  If the clinical suspicion of coronary disease is intermediate consider coronary CTA with FFR.     ? Near syncope 03/04/2017   ? Murmur 03/03/2017     02/25/2017 Echo at [facility]  Mitral valve leaflets appear mildly thickened.  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  The mitral regurgitation jet was central. 2.  Trileaflet aortic valve.  The aortic cusps appear mildly thickened.  Aortic cusps appear mildly calcified.  The peak transaortic gradient was 17.00 mmHg.  The mean transaortic gradient was 7.00 mmHg.  Moderate aortic valve regurgitation.  Normal LV systolic function.  LV EF 62%.  Normal left ventricular wall thickness.            Review of Systems   Constitution: Negative.   HENT: Negative.    Eyes: Negative.    Cardiovascular: Positive for chest pain and dyspnea on exertion.   Respiratory: Negative.    Endocrine: Negative.    Hematologic/Lymphatic: Negative.    Skin: Negative.    Musculoskeletal: Negative.    Gastrointestinal: Negative.    Genitourinary: Negative.    Neurological: Positive for light-headedness.   Psychiatric/Behavioral: Negative.    Allergic/Immunologic: Negative.        Physical Exam  Nursing note and vitals reviewed.  Constitutional: She appears well-developed and well-nourished. No distress.   HENT:   Head: Normocephalic  and atraumatic.   Nose: Nose normal.   Mouth/Throat: Oropharynx is clear and moist.   Eyes: Pupils are equal, round, and reactive to light. Conjunctivae and EOM are normal. No scleral icterus.   Neck: Normal range of motion. No JVD present. Carotid bruit may be present on the left  Cardiovascular: Normal rate, regular rhythm and intact distal pulses. Exam reveals no gallop and no friction rub.   Murmur heard.   Early systolic murmur is present with a grade of 2/6 at the upper right sternal border.  Pulmonary/Chest: Effort normal and breath sounds normal. No respiratory distress. She has no wheezes. She has no rales.   Abdominal: Soft. Bowel sounds are normal. She exhibits no distension. There is no tenderness. There is no guarding.   Musculoskeletal: Normal range of motion. She exhibits no edema or tenderness.   Lymphadenopathy:     She has no cervical adenopathy.   Neurological: She is alert and oriented to person, place, and time. No cranial nerve deficit. Coordination normal.   Skin: Skin is warm and dry. No rash noted. She is not diaphoretic. No erythema.   Psychiatric: She has a normal mood and affect. Her behavior is normal.   ?  ?  Cardiovascular Studies    Problems Addressed Today  Encounter Diagnoses   Name Primary?   ? Essential hypertension Yes   ? Nonrheumatic aortic valve insufficiency    ? Coronary artery disease involving native coronary artery of native heart without angina pectoris    ? Other hyperlipidemia        Assessment and Plan     1. Essential hypertension  ? Her blood pressure is significantly elevated in the office today.  I find this unusual given that she had normal blood pressure when I last saw her about 6 months ago on a moderate dose of just lisinopril.  She is now on twice this dose and still having significant hypertension.  ? Even after resting for a while in clinic upon recheck it was actually 210/100 mmHg  ? She is relatively asymptomatic, but I am concerned.  I think we need to bring it down slowly.  I have asked her to be transported to the [place], [place] emergency department.  Both she and her husband agree.  He will drive.  I also called the emergency department and spoke to the physician on duty to give the clinical situation.  ? I would probably check thyroid levels.  She is also endorsing some heat and cold intolerance  ? I would probably consider starting carvedilol if we need another agent in the future    2. Valvular heart disease  ? 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  She has mild to moderate regurgitation.  ? This finding was stable on echo Doppler study today.  We will plan to continue to monitor clinically with repeat echo every 2 to 3 years barring a change in clinical condition  ? In the past we have discussed concerning signs or symptoms such as increasing dyspnea with exertion, orthopnea, volume overload, etc.  ? She does not require antibiotic prophylaxis at this time    3. Coronary artery disease  ? As described in detail above  ? We will continue secondary preventative therapy with rosuvastatin and aspirin 81 mg daily    4. Dyslipidemia  ? Her most recent fasting lipid profile was performed in November 2020.  Total cholesterol was 186, triglycerides 70, HDL 75, and LDL 97  ? I would  probably ask her to increase her rosuvastatin to 40 mg nightly    Patient's questions were answered and they agreed with the above plan.  Specific instructions were typed into their After Visit Summary document.  Follow-up in 6 months or sooner as needed.  Thank you for the opportunity to participate in the care of your patient.  Please call with questions or concerns.    Gloris Ham, MD, [facility]  Department of Cardiovascular Medicine  [facility]     I spent a total of 60 minutes on the office visit today.  This included making arrangements for the patient's transport to the [place], [place] emergency department.    Current Medications (including today's revisions)  ? acetaminophen (TYLENOL) 325 mg tablet Take 650 mg by mouth twice daily.   ? aspirin 81 mg chewable tablet Chew 81 mg by mouth daily. Take with food.   ? Calcium Carbonate 600 mg calcium (1,500 mg) tab Take 1 tablet by mouth twice daily.   ? famotidine (PEPCID) 20 mg tablet Take 20 mg by mouth daily.   ? lisinopriL (ZESTRIL) 20 mg tablet Take 20 mg by mouth twice daily.   ? MULTIVITAMIN PO Take 1 tablet by mouth daily.   ? rosuvastatin (CRESTOR) 20 mg tablet Take one tablet by mouth daily.   ? Vit A,C & E-Lutein-Minerals 1,000 unit-200 mg-60 unit-2 mg tab Take 1 tablet by mouth daily.

## 2020-01-07 NOTE — Telephone Encounter
Recieved call from [facility]. Pt. was sent there from clinic with HT urgency. When pt. arrived to the ED her BP was 170/74. a recheck 10 min later was 135/56. Dr. Dellis Anes prescribed the pt. 6.25 of croeg BID but advised the pt. call our office back to discuss before taking. [facility] RN team notified and will reach back out to pt.

## 2020-01-07 NOTE — Telephone Encounter
Reviewed note from ER with JAK, he agrees with ER plan.  Pt will monitor blood pressure and call our office if bp runs consistently  >140's then will plan for pt to start Coreg 6.25.  Pt verbalizes understanding.  Will plan to f/u with patient next week to see how bp is running.  No further needs at this time.

## 2020-08-07 ENCOUNTER — Encounter: Admit: 2020-08-07 | Discharge: 2020-08-07 | Payer: MEDICARE

## 2020-08-07 DIAGNOSIS — I351 Nonrheumatic aortic (valve) insufficiency: Secondary | ICD-10-CM

## 2020-08-07 DIAGNOSIS — I10 Essential (primary) hypertension: Secondary | ICD-10-CM

## 2020-08-07 DIAGNOSIS — R5383 Other fatigue: Secondary | ICD-10-CM

## 2020-08-07 DIAGNOSIS — R0600 Dyspnea, unspecified: Secondary | ICD-10-CM

## 2020-08-07 DIAGNOSIS — E785 Hyperlipidemia, unspecified: Secondary | ICD-10-CM

## 2020-08-07 DIAGNOSIS — R55 Syncope and collapse: Secondary | ICD-10-CM

## 2020-08-07 DIAGNOSIS — R011 Cardiac murmur, unspecified: Secondary | ICD-10-CM

## 2020-08-07 DIAGNOSIS — E7849 Other hyperlipidemia: Secondary | ICD-10-CM

## 2020-08-07 DIAGNOSIS — Z09 Encounter for follow-up examination after completed treatment for conditions other than malignant neoplasm: Secondary | ICD-10-CM

## 2020-08-07 DIAGNOSIS — I2510 Atherosclerotic heart disease of native coronary artery without angina pectoris: Secondary | ICD-10-CM

## 2020-08-07 NOTE — Progress Notes
Date of Service: 08/07/2020    Brianna Christensen is a 80 y.o. female.       HPI     Patient is a very pleasant 80 year old white female, she was last seen in our office in June 2021.    She does have a history of primary hypertension, she has been treated with a combination of a beta-blocker and an ACE inhibitor. She also has known coronary artery disease, manifested by coronary artery calcification visualized on previous chest CT performed in October 2018. Patient did undergo evaluation with a perfusion imaging study, it was performed in August 2018, she was not found to have any ischemia, the left ventricular static function was normal.    Patient does not have any symptoms compatible with angina, she does not explain her shortness of breath, chest pain, or heart palpitations. 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    She is also known to have sclerotic aortic valve with mild to moderate regurgitation, patient is not symptomatic with this valvular disorder.    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 She is currently doing well.    Today in our office her blood pressure was elevated. Patient did bring with her blood pressure and heart rate monitoring, for the most part they are within normal range.         Vitals:    08/07/20 0813   BP: (!) 160/86   BP Source: Arm, Left Upper   Patient Position: Sitting   Pulse: 82   SpO2: 97%   Weight: 79.2 kg (174 lb 9.6 oz)   Height: 1.689 m (5' 6.5)   PainSc: Zero     Body mass index is 27.76 kg/m?Marland Kitchen     Past Medical History  Patient Active Problem List    Diagnosis Date Noted   ? 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 CAD (coronary artery disease) 05/18/2017     05/03/17 - CCTA  at [facility] - Mild to moderate areas of mixed plaquing noted. Though FFR assessment suggested no significant obstructive coronary artery disease.  03/18/17 - MPI Stress Test at [facility] - 1. This study is probably normal with mild intensity attenuation in the anterior wall.  All segments are viable global left ventricular function is within normal limits other high risk indicators are not noted.  The patient has poor exercise capacity which placed the patient intermediate risk category but in light of the attenuation artifact anteriorly no definite regional perfusion defects are noted.  If the clinical suspicion of coronary disease is intermediate consider coronary CTA with FFR.       ? Essential hypertension 03/07/2017     Echo - 01/09/18 at [facility] - 1. Normal LV size with concentric remodeling and normal systolic function, EF ~ 60%;  No regional wall motion abnormalities. 2. Diastolic function is technically indeterminate.Normal size atria bilaterally 3. Aortic sclerosis without stenosis.  Mild to moderate regurgitation; 4. Visualized portion of the aortic root is within normal limits according to patient's body surface area. 5. Estimated peak systolic PA pressure =  24 mmHg; 6. No pericardial effusion  ?     ? Fatigue 03/04/2017   ? Hyperlipidemia 03/04/2017     03/18/17 - MPI Stress Test at [facility] - 1. This study is probably normal with mild intensity attenuation in the anterior wall.  All segments are viable global left ventricular function is within normal limits other high risk indicators are not noted.  The patient has poor exercise capacity which placed the patient intermediate risk category but in light of the attenuation artifact anteriorly no definite regional perfusion defects are noted.  If the clinical suspicion of coronary disease is intermediate consider coronary CTA with FFR.     ? Near syncope 03/04/2017   ? Murmur 03/03/2017     02/25/2017 Echo at [facility]  Mitral valve leaflets appear mildly thickened.  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  The mitral regurgitation jet was central. 2.  Trileaflet aortic valve.  The aortic cusps appear mildly thickened.  Aortic cusps appear mildly calcified.  The peak transaortic gradient was 17.00 mmHg.  The mean transaortic gradient was 7.00 mmHg.  Moderate aortic valve regurgitation.  Normal LV systolic function.  LV EF 62%.  Normal left ventricular wall thickness.            Review of Systems   Constitutional: Negative.   HENT: Negative.    Eyes: Negative.    Cardiovascular: Negative.    Respiratory: Negative.    Endocrine: Negative.    Hematologic/Lymphatic: Negative.    Skin: Negative.    Musculoskeletal: Negative.    Gastrointestinal: Negative.    Genitourinary: Negative.    Neurological: Negative.    Psychiatric/Behavioral: Negative.    Allergic/Immunologic: Negative.        Physical Exam  General Appearance: normal in appearance  Skin: warm, moist, no ulcers or xanthomas  Eyes: conjunctivae and lids normal, pupils are equal and round  Lips & Oral Mucosa: no pallor or cyanosis  Neck Veins: neck veins are flat, neck veins are not distended  Chest Inspection: chest is normal in appearance  Respiratory Effort: breathing comfortably, no respiratory distress  Auscultation/Percussion: lungs clear to auscultation, no rales or rhonchi, no wheezing  Cardiac Rhythm: regular rhythm and normal rate  Cardiac Auscultation: S1, S2 normal, no rub, no gallop  Murmurs: no murmur  Carotid Arteries: normal carotid upstroke bilaterally, no bruit  Lower Extremity Edema: no lower extremity edema  Abdominal Exam: soft, non-tender, no masses, bowel sounds normal  Liver & Spleen: no organomegaly  Language and Memory: patient responsive and seems to comprehend information  Neurologic Exam: neurological assessment grossly intact      Cardiovascular Studies  : EKG demonstrates normal sinus rhythm, PACs are present, borderline LVH, normal axis, normal PR and QT intervals.    Cardiovascular Health Factors  Vitals BP Readings from Last 3 Encounters:   08/07/20 (!) 160/86   01/07/20 (!) 200/94   07/11/19 122/78     Wt Readings from Last 3 Encounters:   08/07/20 79.2 kg (174 lb 9.6 oz)   01/07/20 82.6 kg (182 lb)   07/11/19 83.1 kg (183 lb 3.2 oz)     BMI Readings from Last 3 Encounters:   08/07/20 27.76 kg/m?   01/07/20 28.94 kg/m?   07/11/19 29.13 kg/m?      Smoking Social History     Tobacco Use   Smoking Status Never Smoker   Smokeless Tobacco Never Used      Lipid Profile Cholesterol   Date Value Ref Range Status   06/04/2019 186  Final     HDL   Date Value Ref Range Status   06/04/2019 75  Final     LDL   Date Value Ref Range Status   06/04/2019 97  Final     Triglycerides  Date Value Ref Range Status   06/04/2019 70  Final      Blood Sugar No results found for: HGBA1C  Glucose   Date Value Ref Range Status   12/27/2019 114 (H) 70 - 105 Final   06/04/2019 98  Final   05/22/2018 100  Final          Problems Addressed Today  Encounter Diagnoses   Name Primary?   ? Coronary artery disease involving native coronary artery of native heart without angina pectoris Yes   ? Essential hypertension    ? Other hyperlipidemia    ? Near syncope    ? Nonrheumatic aortic valve insufficiency    ? Hospital discharge follow-up    ? Murmur        Assessment and Plan     In summary: This is a 80 year old white female that presents with the following cardiovascular/clinical issues:    1. Asymptomatic coronary artery disease  ? This was visualized on a previous cardiac CTA performed in October 2018  ? Patient was evaluated with a perfusion imaging study, she was not found to have ischemia, the systolic function is normal    2. Primary hypertension  ? Probably there is a component of whitecoat hypertension  ? Patient's blood pressure at home is within normal range  ? She is currently on a combination of beta-blocker and ACE inhibitor    3. Hyperlipidemia  ? Patient is on statin therapy, she is due for a annual physical evaluation with you and she will have blood drawn    4. Patient was hospitalized at [facility]  ? This occurred secondary to developing colitis  ? Patient is currently symptom-free    Plan:    1. I asked the patient to continue all current medications  2. I did encourage a low-sodium diet  3. I also asked the patient to perform daily physical activity  4. I encouraged to take daily vitamins: Zinc, vitamin D3, vitamin C and quercetin  5. Follow-up office visit in approximately 6 months    Total Time Today was 40 minutes in the following activities: Preparing to see the patient, Obtaining and/or reviewing separately obtained history, Performing a medically appropriate examination and/or evaluation, Counseling and educating the patient/family/caregiver, Referring and communication with other health care professionals (when not separately reported), Documenting clinical information in the electronic or other health record, Independently interpreting results (not separately reported) and communicating results to the patient/family/caregiver and Care coordination (not separately reported)         Current Medications (including today's revisions)  ? acetaminophen (TYLENOL) 325 mg tablet Take 650 mg by mouth twice daily.   ? aspirin 81 mg chewable tablet Chew 81 mg by mouth daily. Take with food.   ? Calcium Carbonate 600 mg calcium (1,500 mg) tab Take 1 tablet by mouth twice daily.   ? famotidine (PEPCID) 20 mg tablet Take 20 mg by mouth daily.   ? FEROSUL 325 mg (65 mg iron) tablet Take 325 mg by mouth twice daily.   ? ferrous gluconate 324 mg (38 mg iron) tab Take 324 mg by mouth daily.   ? lisinopriL (ZESTRIL) 20 mg tablet Take 20 mg by mouth twice daily.   ? metoprolol tartrate (LOPRESSOR) 50 mg tablet 50 mg.   ? MULTIVITAMIN PO Take 1 tablet by mouth daily.   ? rosuvastatin (CRESTOR) 20 mg tablet Take one tablet by mouth daily.   ? Vit A,C & E-Lutein-Minerals 1,000 unit-200 mg-60  unit-2 mg tab Take 1 tablet by mouth daily.

## 2020-08-25 IMAGING — MG MAMMOGRAM 3D SCREEN, BILATERAL
10 of 16 series · 10 of 16 positions shown · non-contrast
Comparison: none

[R CC (1 of 2)]
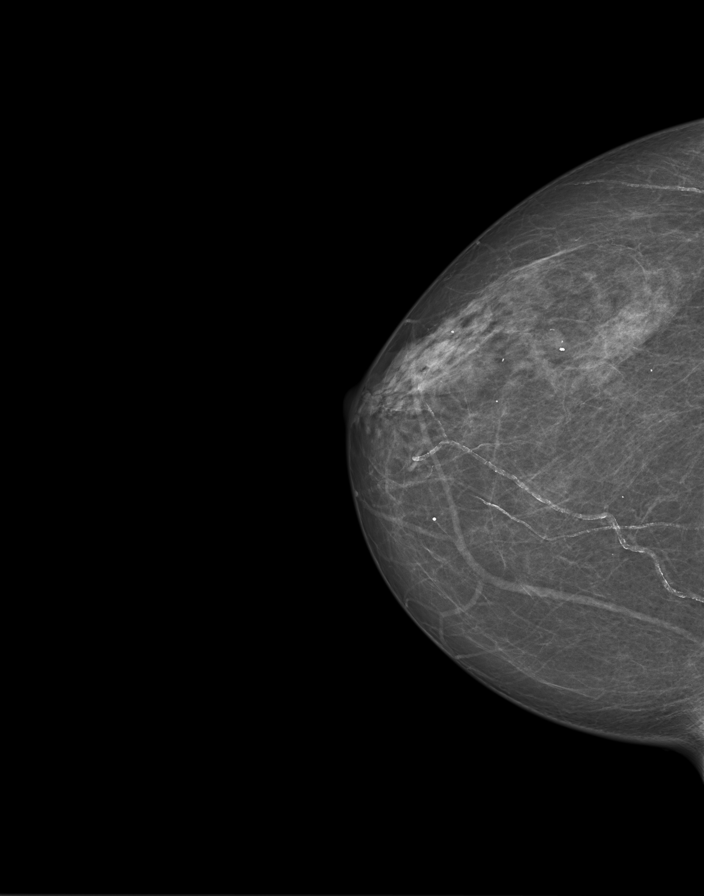

[R tomo (1 of 2)]
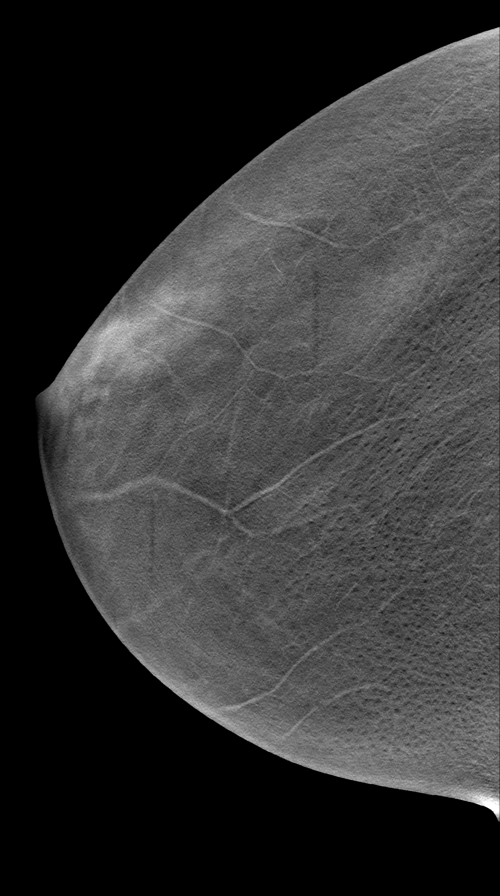

[R CC (2 of 2)]
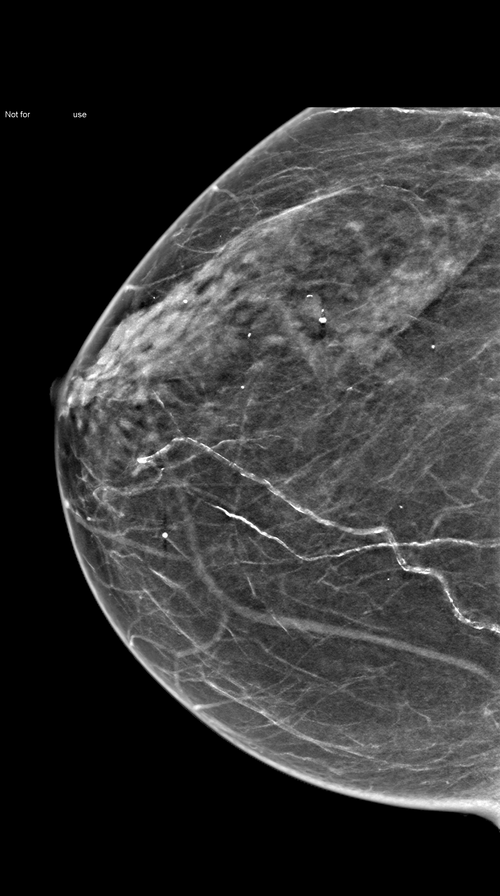

[R]
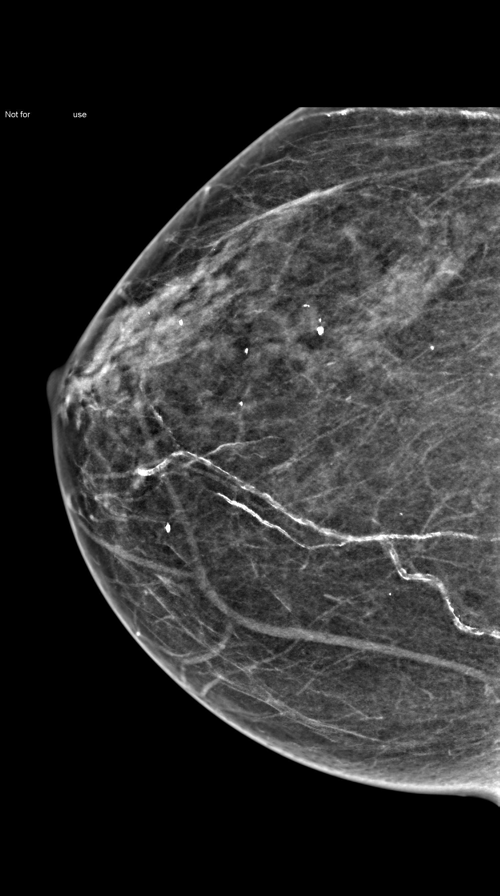

[L CC (1 of 2)]
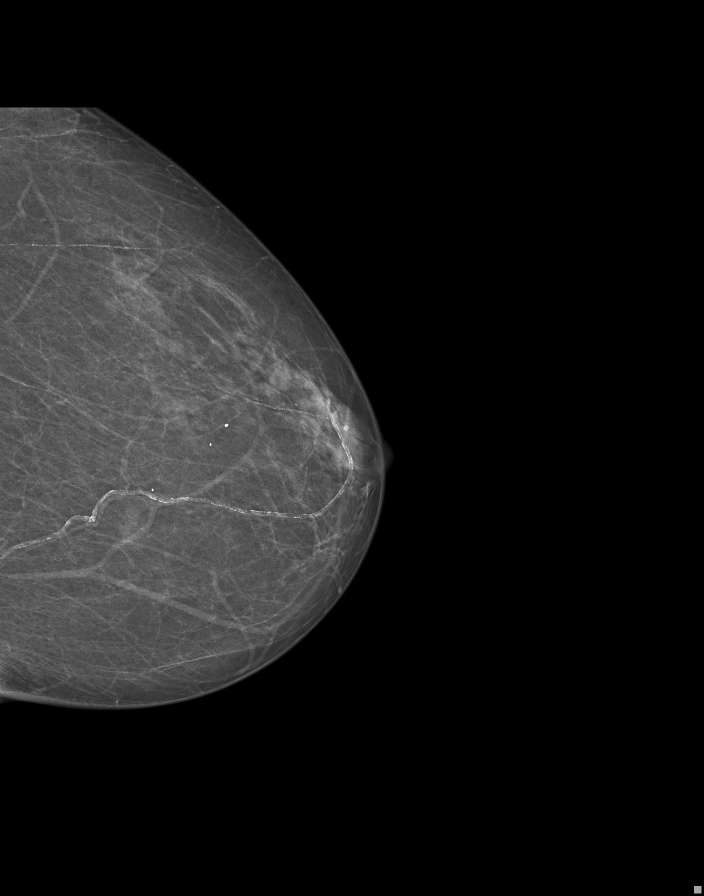

[L tomo]
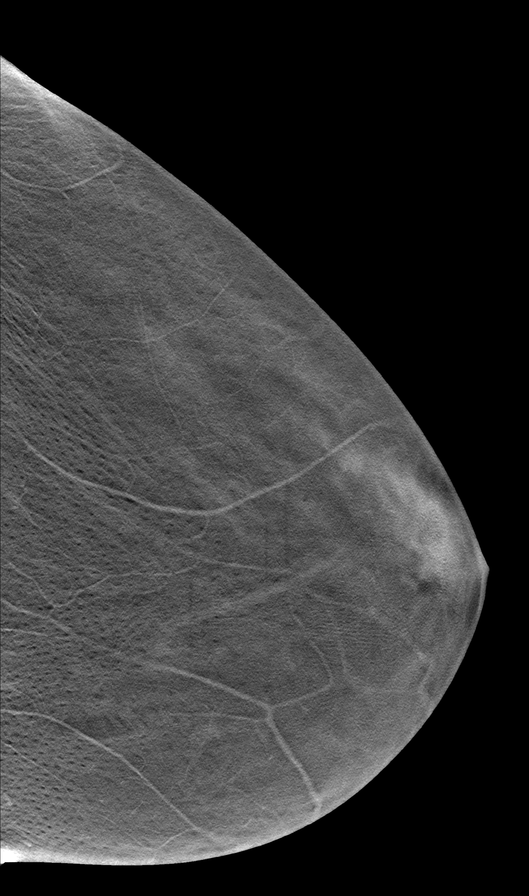

[L CC (2 of 2)]
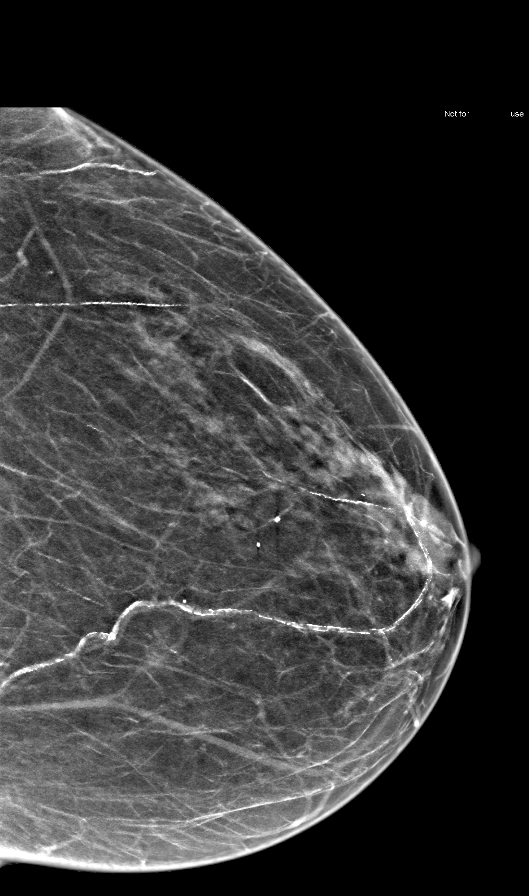

[L]
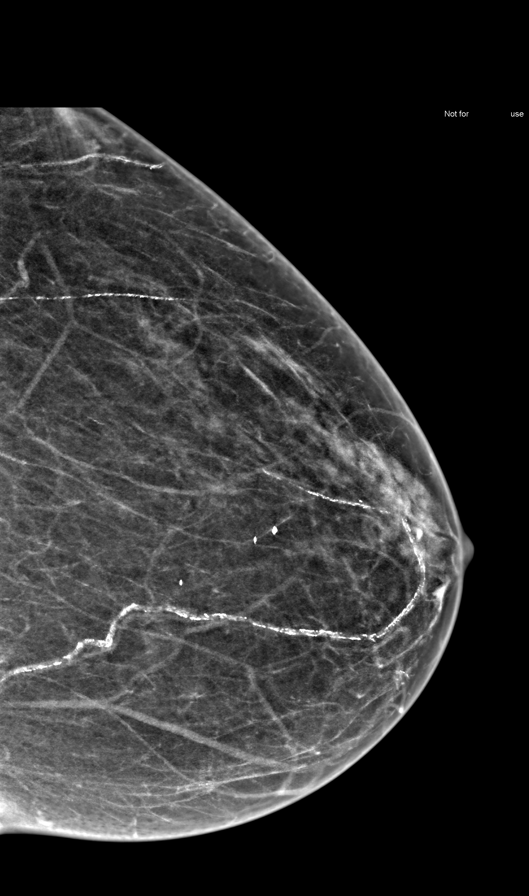

[R MLO]
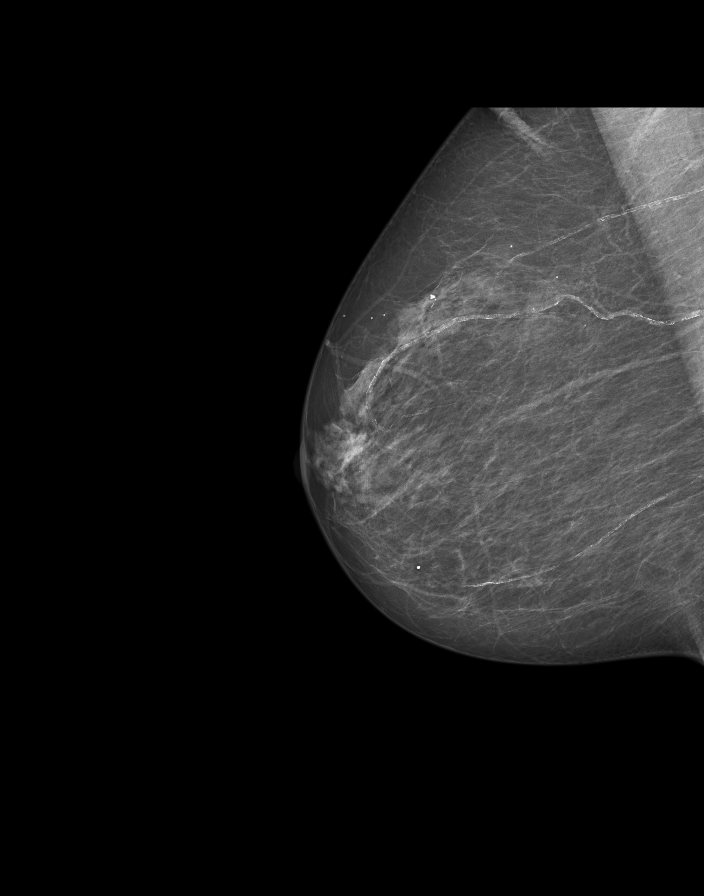

[R tomo (2 of 2)]
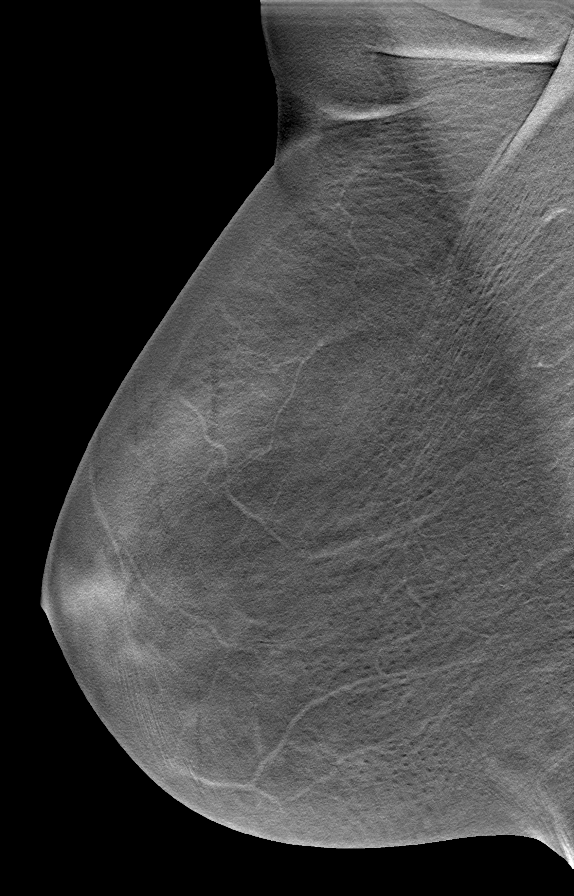

[10 of 16 positions shown; findings below may reference images not displayed]

EXAM

MAMMOGRAM

INDICATION

ATH screening
SCR 3D. LM

TECHNIQUE

2D and tomosynthesis digital craniocaudal and mediolateral oblique views were obtained of both
breasts. Computer aided detection software was utilized.

COMPARISONS

07/26/2019 back through 03/07/2017.

FINDINGS

There is scattered fibroglandular tissue.

[There is no suspicious microcalcification, architectural distortion, or spiculated mass.]

Benign type calcifications bilaterally.

IMPRESSION

BI-RADS 2: Benign findings. Recommend annual screening mammography.

A reminder letter will be sent.

Tech Notes:

## 2020-10-03 IMAGING — MR Shoulder^ROUTINE
4 of 7 series · 23 of 40 positions shown · non-contrast
Comparison: none

[Series 3: T2 fat-sat · axial · 4.0mm · 0.35mm/px · z∈[-52,+53]mm · 7 of 23 slices shown (1 of 2)]
[im 1/23]
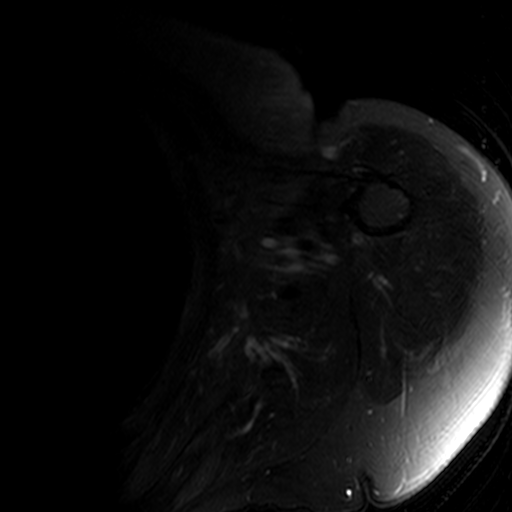
[im 4/23]
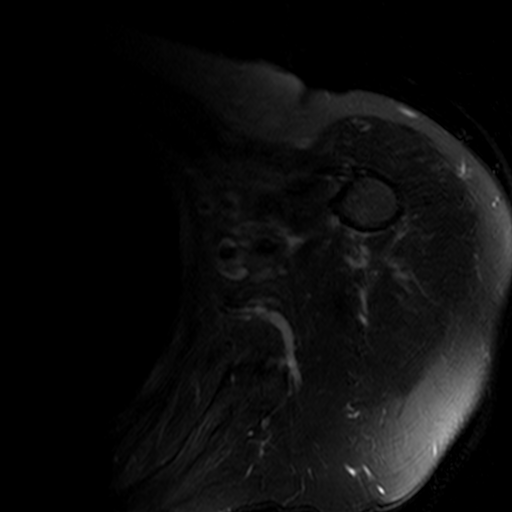
[im 8/23]
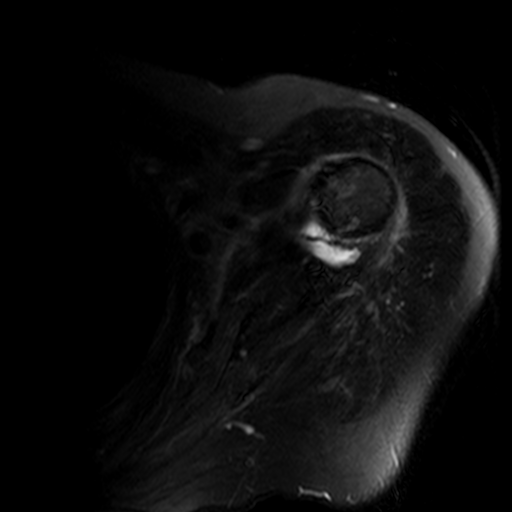
[im 12/23]
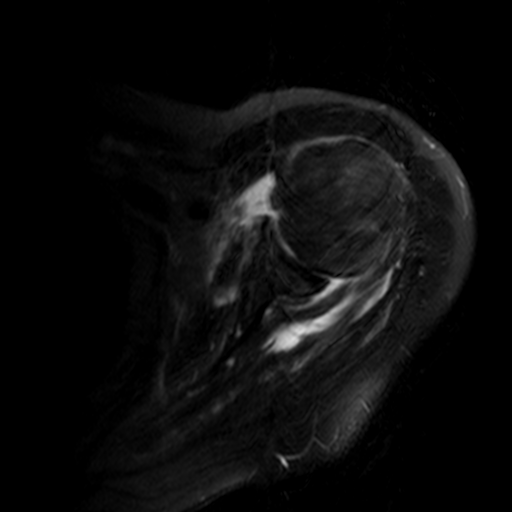
[im 15/23]
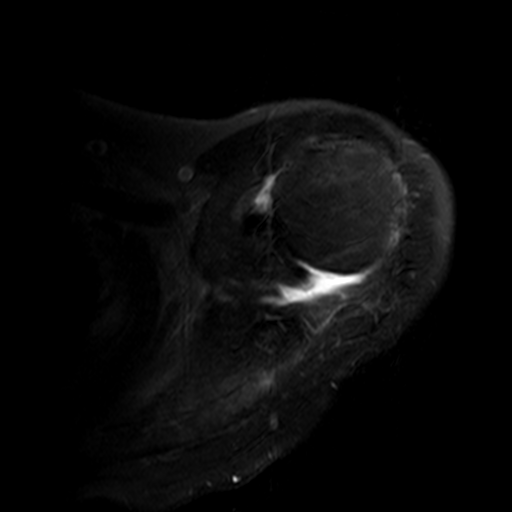
[im 19/23]
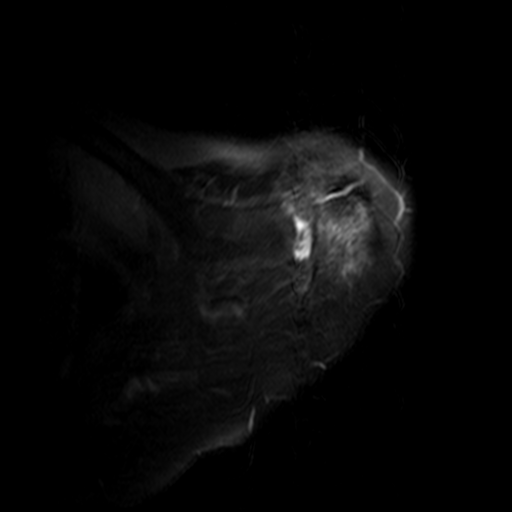
[im 23/23]
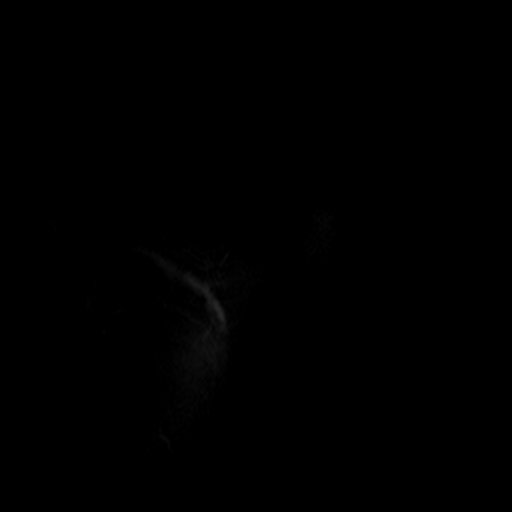

[Series 4: T1 · oblique · 4.0mm · 0.44mm/px · 7 of 22 slices shown]
[im 1/22]
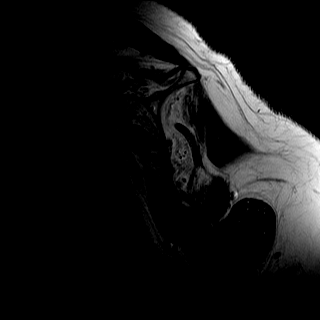
[im 4/22]
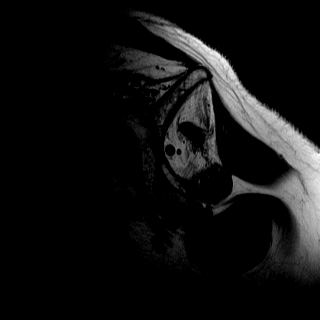
[im 8/22]
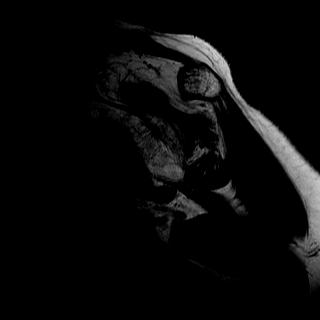
[im 11/22]
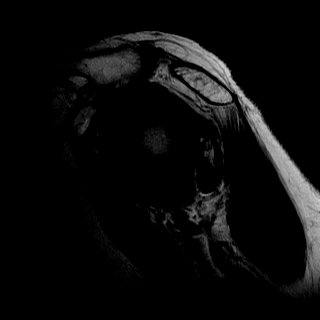
[im 15/22]
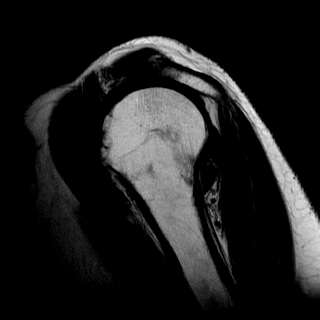
[im 18/22]
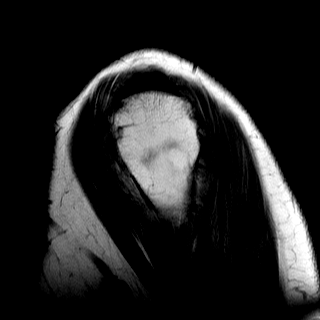
[im 22/22]
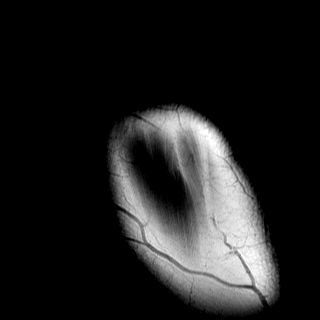

[Series 5: STIR · oblique · 4.0mm · 0.27mm/px · 3 of 22 slices shown]
[im 4/22]
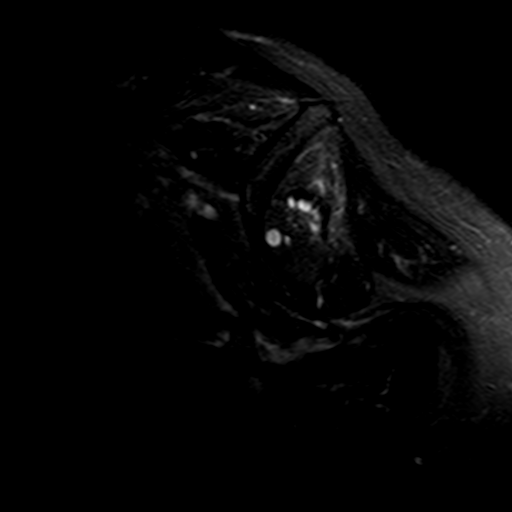
[im 11/22]
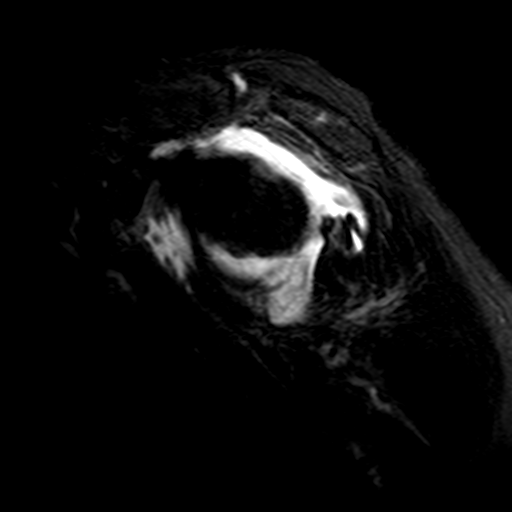
[im 18/22]
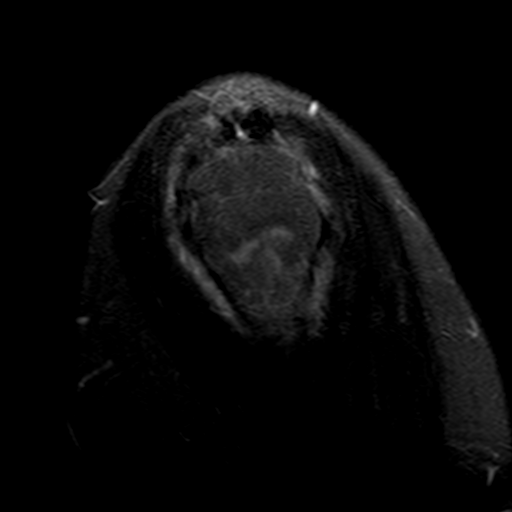

[Series 6: T2 fat-sat · oblique · 4.0mm · 0.27mm/px · 6 of 18 slices shown (2 of 2)]
[im 1/18]
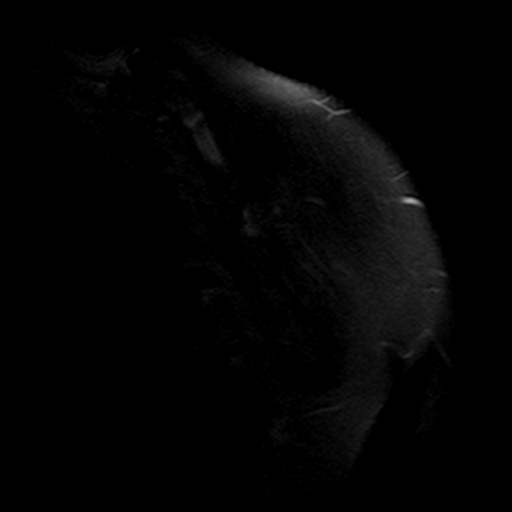
[im 4/18]
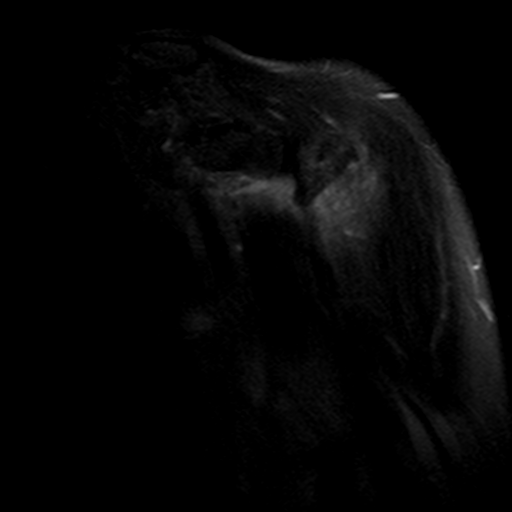
[im 7/18]
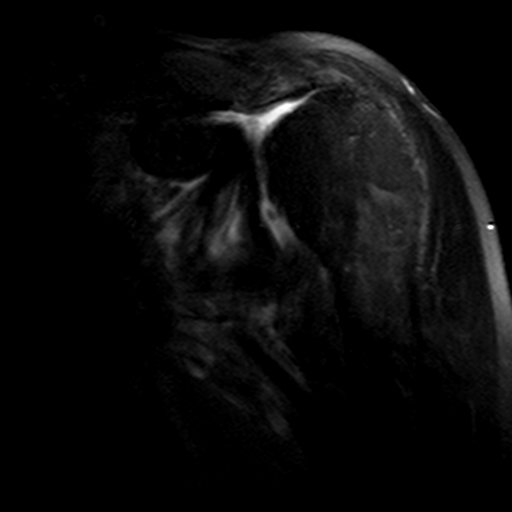
[im 11/18]
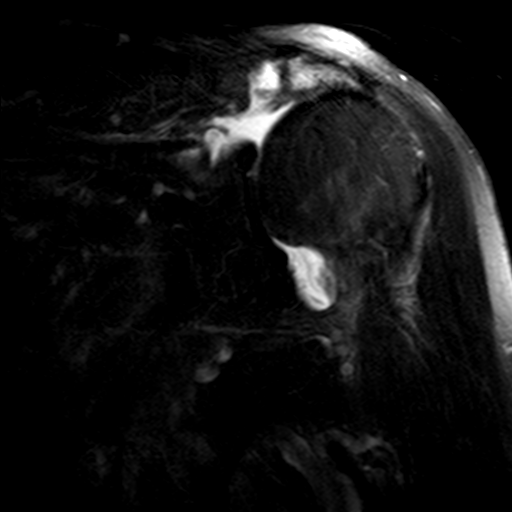
[im 14/18]
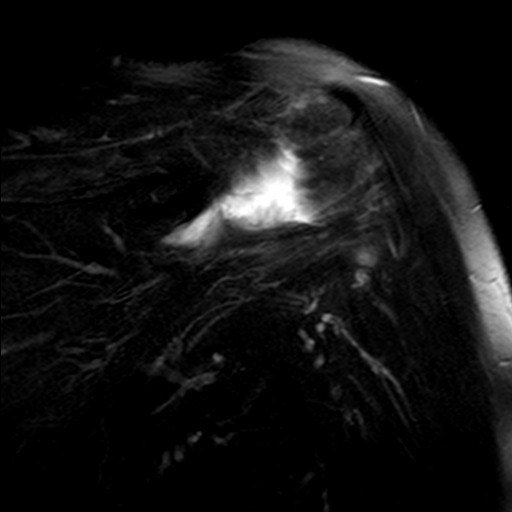
[im 18/18]
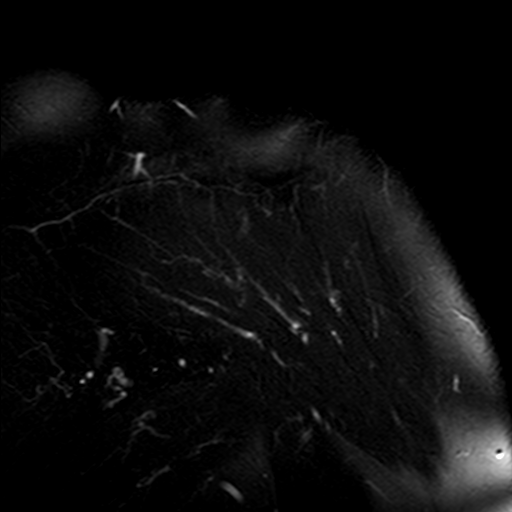

[23 of 40 positions shown; findings below may reference images not displayed]

EXAM

MRI left shoulder without contrast

INDICATION

shoulder pain
chronic left shoulder pain, worsening, limited ROM

FINDINGS

Coronal fat suppressed T2, sagittal T1 and STIR and axial fat suppressed T2 weighted images of the
left shoulder were obtained.

There is evidence of a massive full thickness rotator cuff tear of the supraspinatus and
infraspinatus tendons. There is retraction of the myotendinous junctions and superior migration of
the humeral head. There is pronounced fatty atrophy of the supraspinatus and infraspinatus muscles.
For are there is a complete or high-grade partial tear of the subscapularis tendon.

There is a moderately large joint effusion. There is also fluid within the left AC joint space
which communicates with the subdeltoid bursa. There are osteophytes of the distal clavicle and
acromion process.

There is attenuation of the tendon of the long head of the biceps with a partial tear proximally.
There is limited visualization of the tendon a partially due to motion artifact.

No large labral defect is identified.

No acute bone abnormality is identified.

IMPRESSION

There is a massive, probably chronic full-thickness rotator cuff tear involving the supraspinatus
and infraspinatus tendons. There is retraction of the myotendinous junctions and superior migration
of the humeral head. There is pronounced fatty atrophy of the supraspinatus and infraspinatus
muscles. There is a high-grade partial or full-thickness tear of the subscapularis tendon.

There is a moderately large joint effusion all. There is fluid extending into the AC joint space.
There is osteoarthrosis of the AC joint.

There is a partial tear of the tendon of the long head of the biceps.

There is no acute bone lesion.

Tech Notes:

chronic left shoulder pain, worsening, limited ROM

## 2021-01-12 IMAGING — CR HIPCMLT
2 series · 2 of 2 positions shown · non-contrast
Comparison: none

[hip ap pelvis]
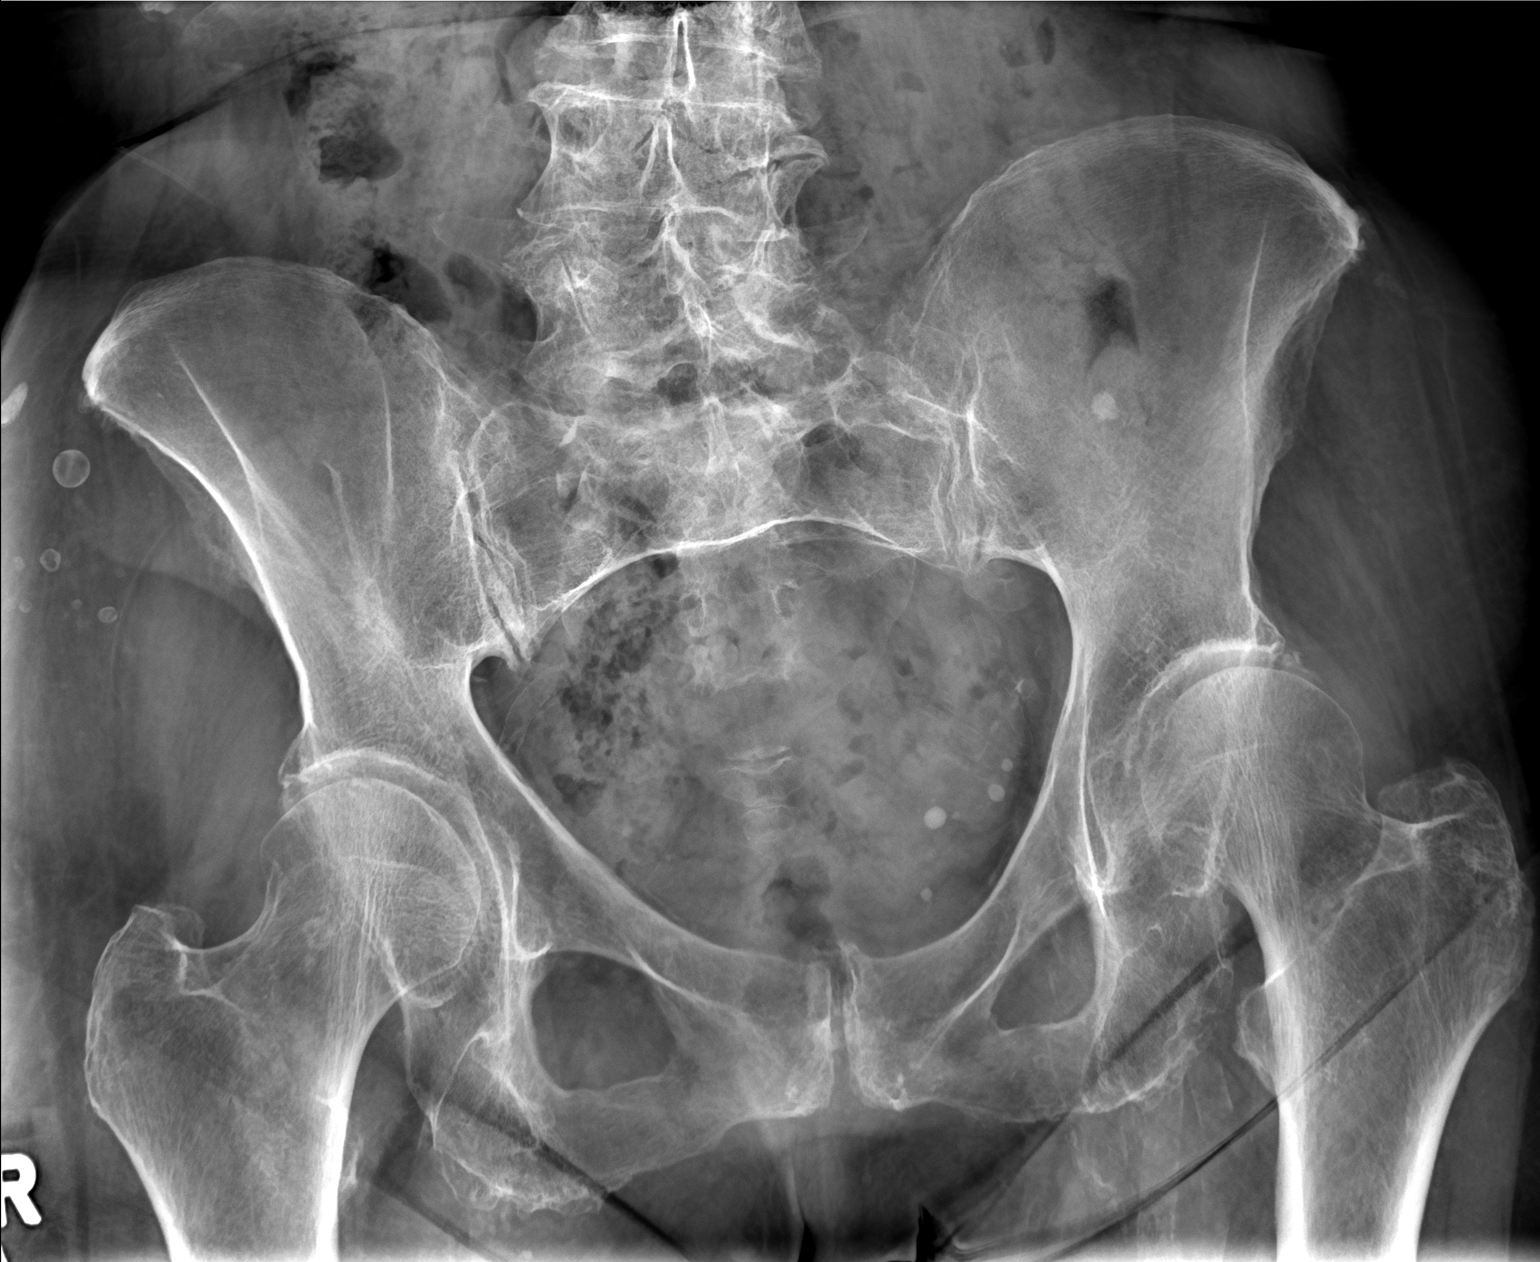

[hip frog lat]
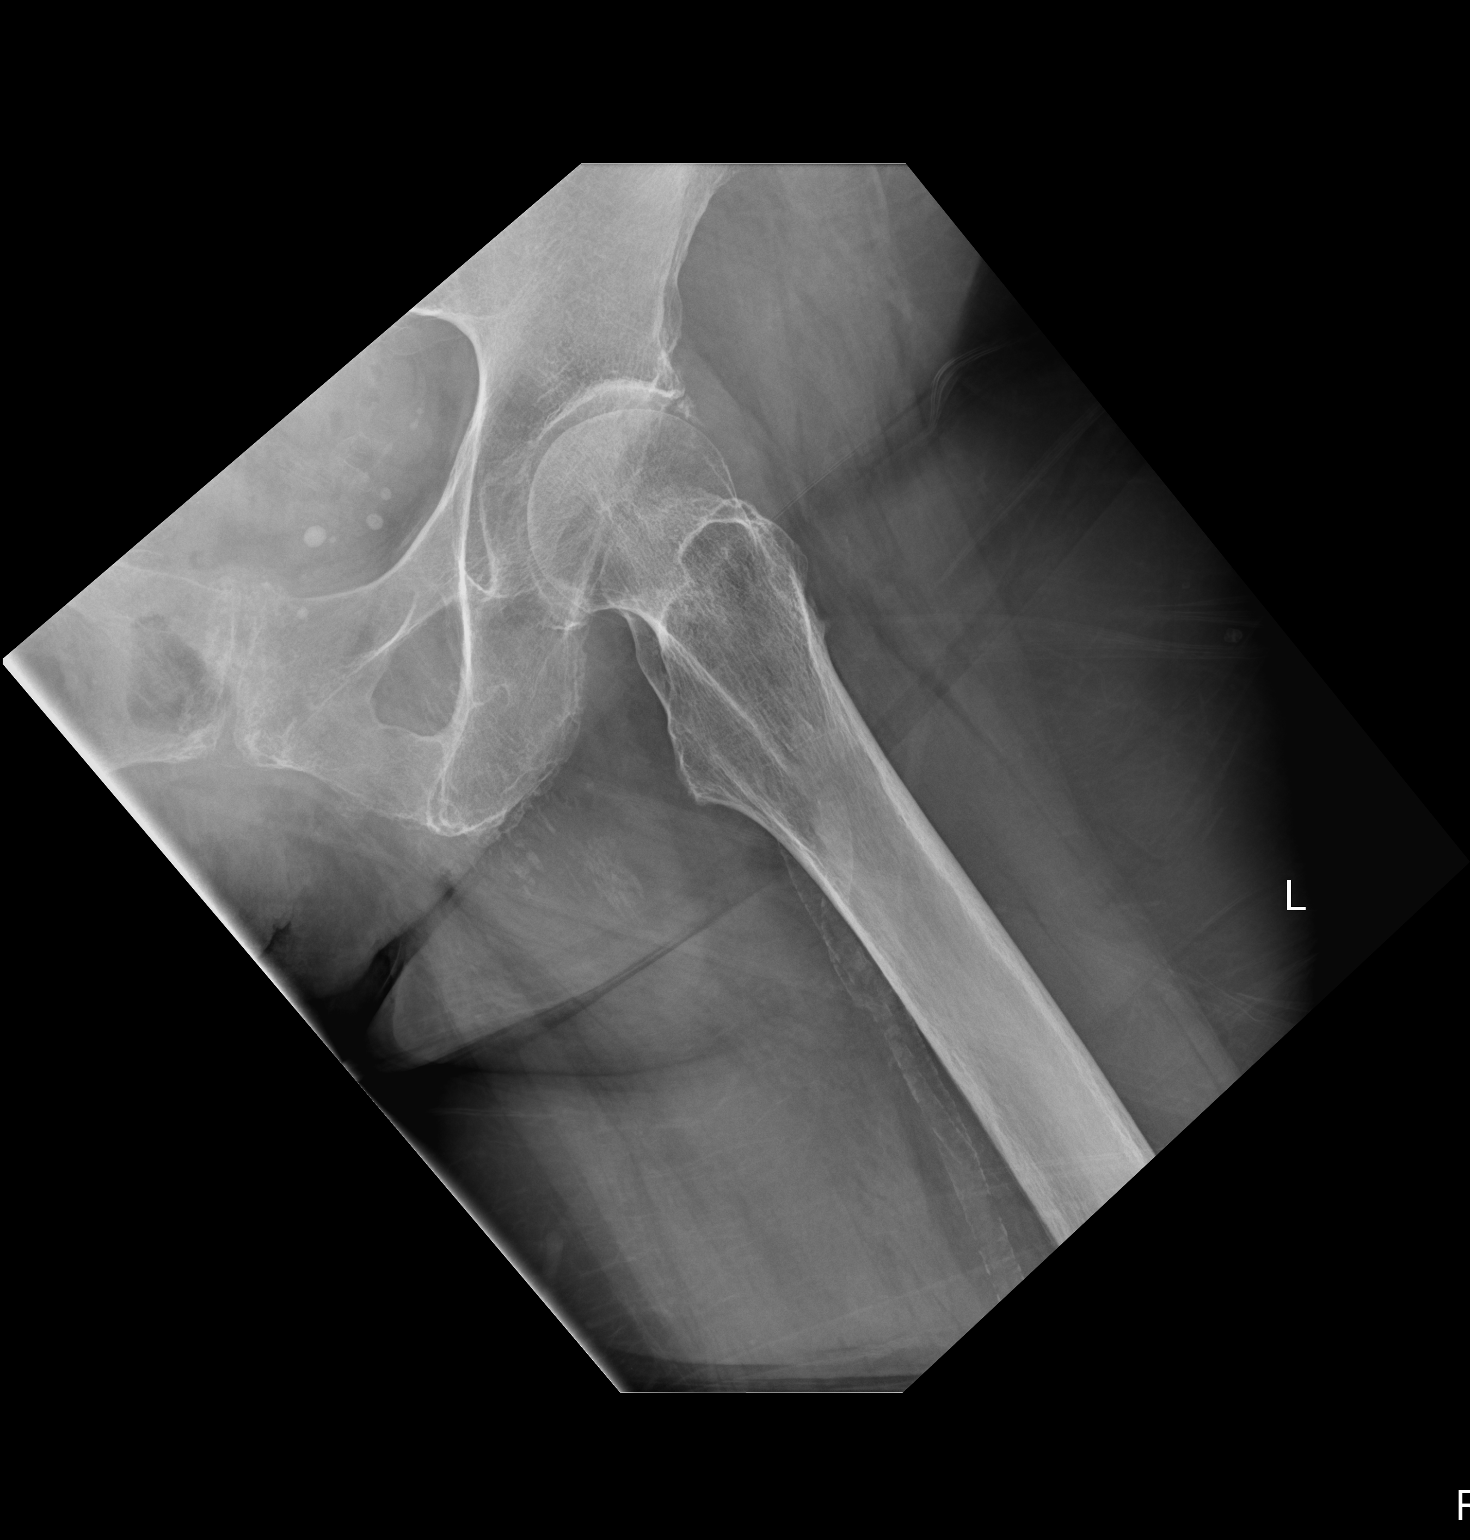

[2 of 2 positions shown; findings below may reference images not displayed]

EXAM

Left hip with pelvis

INDICATION

hip pain
Left hip pain, no known injury.

FINDINGS

Two views of the left hip and a single view of the pelvis were obtained.

There is mild narrowing of the left hip joint space. There are osteophytes of the left acetabulum.
There is a small os acetabuli on the left which appears chronic.

There is no acute abnormality of the pelvis.

There are degenerative changes of the lower lumbar spine.

IMPRESSION

There is mild osteoarthrosis of the left hip. There is no acute abnormality left hip or pelvis.

Tech Notes:

Left hip pain, no known injury.

## 2021-02-03 ENCOUNTER — Encounter: Admit: 2021-02-03 | Discharge: 2021-02-03 | Payer: MEDICARE

## 2021-02-05 ENCOUNTER — Encounter: Admit: 2021-02-05 | Discharge: 2021-02-05 | Payer: MEDICARE

## 2021-02-05 DIAGNOSIS — R0989 Other specified symptoms and signs involving the circulatory and respiratory systems: Secondary | ICD-10-CM

## 2021-02-05 DIAGNOSIS — E7849 Other hyperlipidemia: Secondary | ICD-10-CM

## 2021-02-05 DIAGNOSIS — R55 Syncope and collapse: Secondary | ICD-10-CM

## 2021-02-05 DIAGNOSIS — R5383 Other fatigue: Secondary | ICD-10-CM

## 2021-02-05 DIAGNOSIS — E785 Hyperlipidemia, unspecified: Secondary | ICD-10-CM

## 2021-02-05 DIAGNOSIS — I2510 Atherosclerotic heart disease of native coronary artery without angina pectoris: Secondary | ICD-10-CM

## 2021-02-05 DIAGNOSIS — I351 Nonrheumatic aortic (valve) insufficiency: Secondary | ICD-10-CM

## 2021-02-05 DIAGNOSIS — I10 Essential (primary) hypertension: Secondary | ICD-10-CM

## 2021-02-05 DIAGNOSIS — R011 Cardiac murmur, unspecified: Secondary | ICD-10-CM

## 2021-02-05 DIAGNOSIS — R0600 Dyspnea, unspecified: Secondary | ICD-10-CM

## 2021-02-05 NOTE — Progress Notes
Date of Service: 02/05/2021    Brianna Christensen is a 80 y.o. female.       HPI     Patient is a 80 year old white female with a history of primary hypertension, coronary artery disease manifested by coronary artery calcification visualized on a chest CT performed in October 2018, status post evaluation with a stress test in August 2018 (she was not found to have any ischemia, normal ventricular static function), known mild to moderate mitral valve regurgitation, hyperlipidemia on statin therapy.    Patient reports mild intensity left-sided chest pain, she points that it with her index, and she states that it feels like somebody is stepping on her chest, these episodes are not associated with physical activity, no shortness of breath and no heart palpitations, she did not have to take sublingual nitroglycerin.    An echocardiogram performed in June 2021 demonstrated normal systolic function grade 1 diastolic dysfunction aortic sclerosis without stenosis and mild to moderate regurgitation.         Vitals:    02/05/21 1501   BP: 132/68   BP Source: Arm, Left Upper   Pulse: 90   SpO2: 98%   O2 Percent: 98 %   O2 Device: None (Room air)   PainSc: Zero   Weight: 80.3 kg (177 lb)   Height: 168.9 cm (5' 6.5)     Body mass index is 28.14 kg/m?Brianna Christensen     Past Medical History  Patient Active Problem List    Diagnosis Date Noted   ? Hospital discharge follow-up 08/07/2020   ? 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 Coronary artery calcification 05/18/2017     05/03/17 - CCTA  at [facility] - Mild to moderate areas of mixed plaquing noted. Though FFR assessment suggested no significant obstructive coronary artery disease.  03/18/17 - MPI Stress Test at [facility] - 1. This study is probably normal with mild intensity attenuation in the anterior wall.  All segments are viable global left ventricular function is within normal limits other high risk indicators are not noted.  The patient has poor exercise capacity which placed the patient intermediate risk category but in light of the attenuation artifact anteriorly no definite regional perfusion defects are noted.  If the clinical suspicion of coronary disease is intermediate consider coronary CTA with FFR.       ? Essential hypertension 03/07/2017     Echo - 01/09/18 at [facility] - 1. Normal LV size with concentric remodeling and normal systolic function, EF ~ 60%;  No regional wall motion abnormalities. 2. Diastolic function is technically indeterminate.Normal size atria bilaterally 3. Aortic sclerosis without stenosis.  Mild to moderate regurgitation; 4. Visualized portion of the aortic root is within normal limits according to patient's body surface area. 5. Estimated peak systolic PA pressure =  24 mmHg; 6. No pericardial effusion  ?     ? Fatigue 03/04/2017   ? Hyperlipidemia 03/04/2017     03/18/17 - MPI Stress Test at [facility] - 1. This study is probably normal with mild intensity attenuation in the anterior wall.  All segments are viable global left ventricular function is within normal limits other high risk indicators are not noted.  The patient has poor exercise capacity which placed the patient intermediate risk category but in light of the attenuation artifact anteriorly no definite regional perfusion defects are noted.  If the clinical suspicion of coronary disease is intermediate consider coronary CTA with FFR.     ? Near syncope 03/04/2017   ?  Murmur 03/03/2017     02/25/2017 Echo at [facility]  Mitral valve leaflets appear mildly thickened.  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  The mitral regurgitation jet was central. 2.  Trileaflet aortic valve.  The aortic cusps appear mildly thickened.  Aortic cusps appear mildly calcified.  The peak transaortic gradient was 17.00 mmHg.  The mean transaortic gradient was 7.00 mmHg.  Moderate aortic valve regurgitation.  Normal LV systolic function.  LV EF 62%.  Normal left ventricular wall thickness.            Review of Systems Constitutional: Negative.   HENT: Positive for congestion and sore throat.    Eyes: Negative.    Cardiovascular: Negative.    Respiratory: Positive for cough and sputum production.    Endocrine: Negative.    Hematologic/Lymphatic: Negative.    Skin: Negative.    Musculoskeletal: Negative.    Gastrointestinal: Negative.    Genitourinary: Negative.    Neurological: Negative.    Psychiatric/Behavioral: Negative.    Allergic/Immunologic: Negative.        Physical Exam  General Appearance: normal in appearance  Skin: warm, moist, no ulcers or xanthomas  Eyes: conjunctivae and lids normal, pupils are equal and round  Lips & Oral Mucosa: no pallor or cyanosis  Neck Veins: neck veins are flat, neck veins are not distended  Chest Inspection: chest is normal in appearance  Respiratory Effort: breathing comfortably, no respiratory distress  Auscultation/Percussion: lungs clear to auscultation, no rales or rhonchi, no wheezing  Cardiac Rhythm: regular rhythm and normal rate  Cardiac Auscultation: S1, S2 normal, no rub, no gallop  Murmurs: no murmur  Carotid Arteries: normal carotid upstroke bilaterally, bilateral bruits  Lower Extremity Edema: no lower extremity edema  Abdominal Exam: soft, non-tender, no masses, bowel sounds normal  Liver & Spleen: no organomegaly  Language and Memory: patient responsive and seems to comprehend information  Neurologic Exam: neurological assessment grossly intact      Cardiovascular Studies      Cardiovascular Health Factors  Vitals BP Readings from Last 3 Encounters:   02/05/21 132/68   08/07/20 (!) 160/86   01/07/20 (!) 200/94     Wt Readings from Last 3 Encounters:   02/05/21 80.3 kg (177 lb)   08/07/20 79.2 kg (174 lb 9.6 oz)   01/07/20 82.6 kg (182 lb)     BMI Readings from Last 3 Encounters:   02/05/21 28.14 kg/m?   08/07/20 27.76 kg/m?   01/07/20 28.94 kg/m?      Smoking Social History     Tobacco Use   Smoking Status Never Smoker   Smokeless Tobacco Never Used      Lipid Profile Cholesterol   Date Value Ref Range Status   01/20/2021 188  Final     HDL   Date Value Ref Range Status   01/20/2021 68  Final     LDL   Date Value Ref Range Status   01/20/2021 106 (H) <100 Final     Triglycerides   Date Value Ref Range Status   01/20/2021 71  Final      Blood Sugar No results found for: HGBA1C  Glucose   Date Value Ref Range Status   01/20/2021 92  Final   07/11/2020 106 (H) 70 - 105 Final   12/27/2019 114 (H) 70 - 105 Final          Problems Addressed Today  Encounter Diagnoses   Name Primary?   ? Coronary artery calcification Yes   ?  Essential hypertension    ? Other hyperlipidemia    ? Nonrheumatic aortic valve insufficiency    ? Bruit    ? Bilateral carotid bruits        Assessment and Plan     Assessment:    1.  Bilateral carotid bruit-this was a new finding today on the physical exam  2.  Primary hypertension- this is under good control, patient is currently on an ACE inhibitor and chlorthalidone as well as a beta-blocker  3.  Hyperlipidemia-on statin therapy  4.  Mild to moderate aortic valve insufficiency-this was last checked on an echocardiogram performed in June 2021, patient does not have any increase symptoms of shortness of breath  5.  Atypical chest pain  6.  Coronary artery calcifications-no symptoms of anginal  7.  Unremarkable perfusion imaging study performed in August 2018    Plan:    1.  Continue all current medications  2.  Continue risk factors modifications  3.  Further evaluation with a carotid artery duplex-we will follow-up on the results of the test and will call the patient with further recommendations  4.  Follow-up office visit in approximately 6 to 8 months, next in 2023 we will most likely repeat 2D echocardiogram and a perfusion imaging study.    Total Time Today was 30 minutes in the following activities: Preparing to see the patient, Obtaining and/or reviewing separately obtained history, Performing a medically appropriate examination and/or evaluation, Counseling and educating the patient/family/caregiver, Ordering medications, tests, or procedures, Referring and communication with other health care professionals (when not separately reported), Documenting clinical information in the electronic or other health record, Independently interpreting results (not separately reported) and communicating results to the patient/family/caregiver and Care coordination (not separately reported)         Current Medications (including today's revisions)  ? acetaminophen (TYLENOL) 325 mg tablet Take 650 mg by mouth twice daily.   ? Calcium Carbonate 600 mg calcium (1,500 mg) tab Take 1 tablet by mouth twice daily.   ? cholecalciferol (vitamin D3) (VITAMIN D3) 1,000 units tablet Take 1,000 Units by mouth daily.   ? diphenhydramine HCl (ALLERGY RELIEF PO) Take 1 tablet by mouth daily.   ? FEROSUL 325 mg (65 mg iron) tablet Take 325 mg by mouth daily.   ? lisinopriL (ZESTRIL) 20 mg tablet Take 20 mg by mouth twice daily.   ? metoprolol tartrate (LOPRESSOR) 50 mg tablet Take 50 mg by mouth daily.   ? MULTIVITAMIN PO Take 1 tablet by mouth daily.   ? rosuvastatin (CRESTOR) 20 mg tablet Take one tablet by mouth daily.   ? Vit A,C & E-Lutein-Minerals 1,000 unit-200 mg-60 unit-2 mg tab Take 1 tablet by mouth daily.

## 2021-02-10 IMAGING — US CARDUPBI
1 series · 7 of 7 positions shown · non-contrast
Comparison: none

[Series 1: us carotid duplex bi · 7 of 7 slices shown]
[im 1/7]
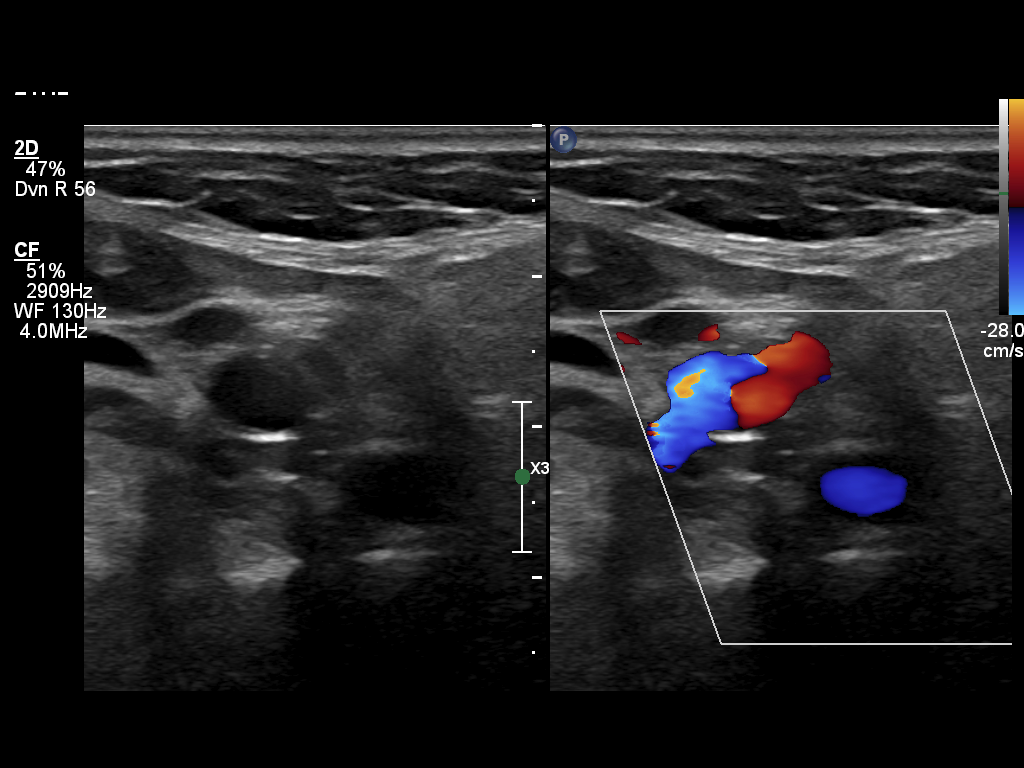
[im 2/7]
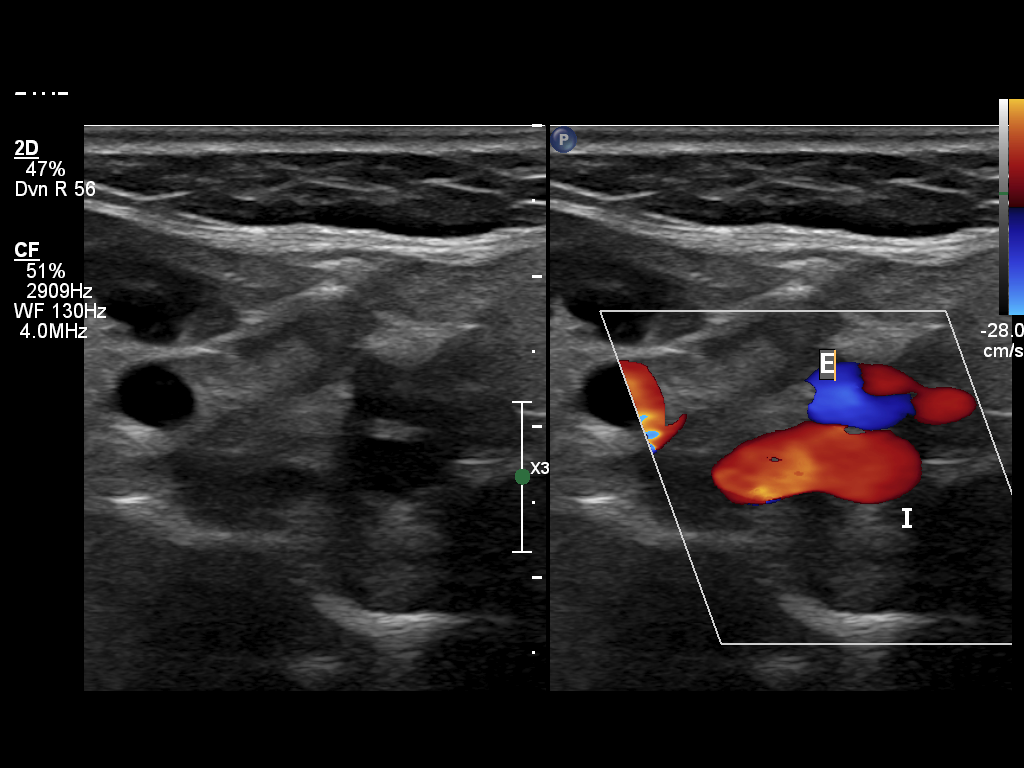
[im 3/7]
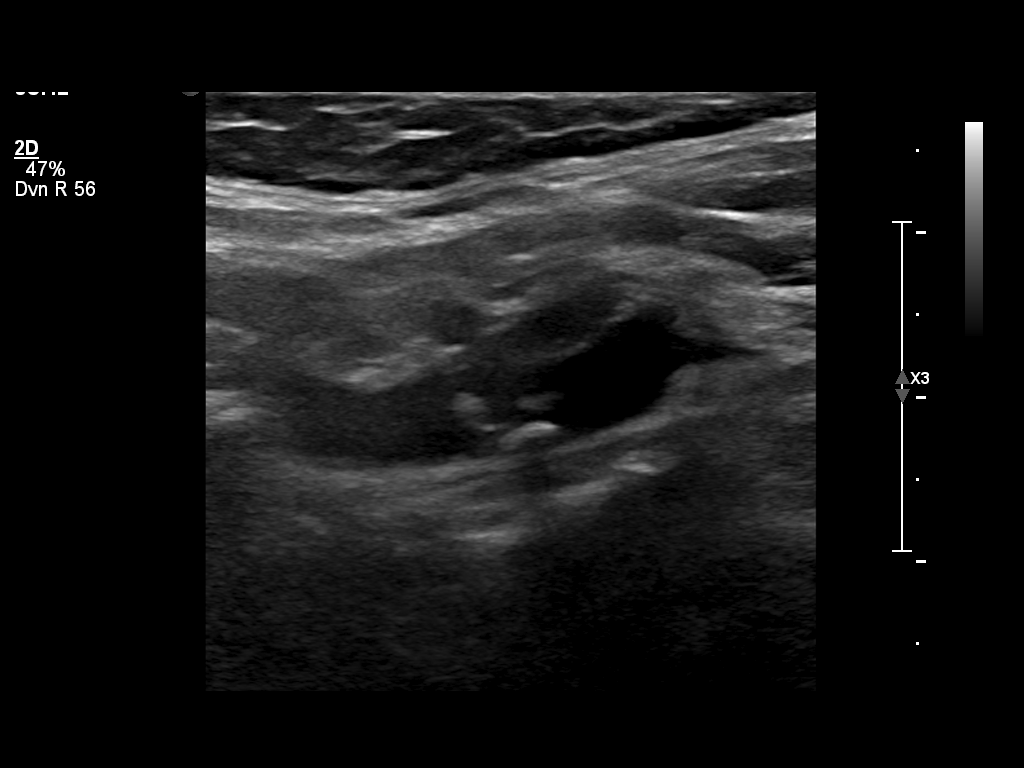
[im 4/7]
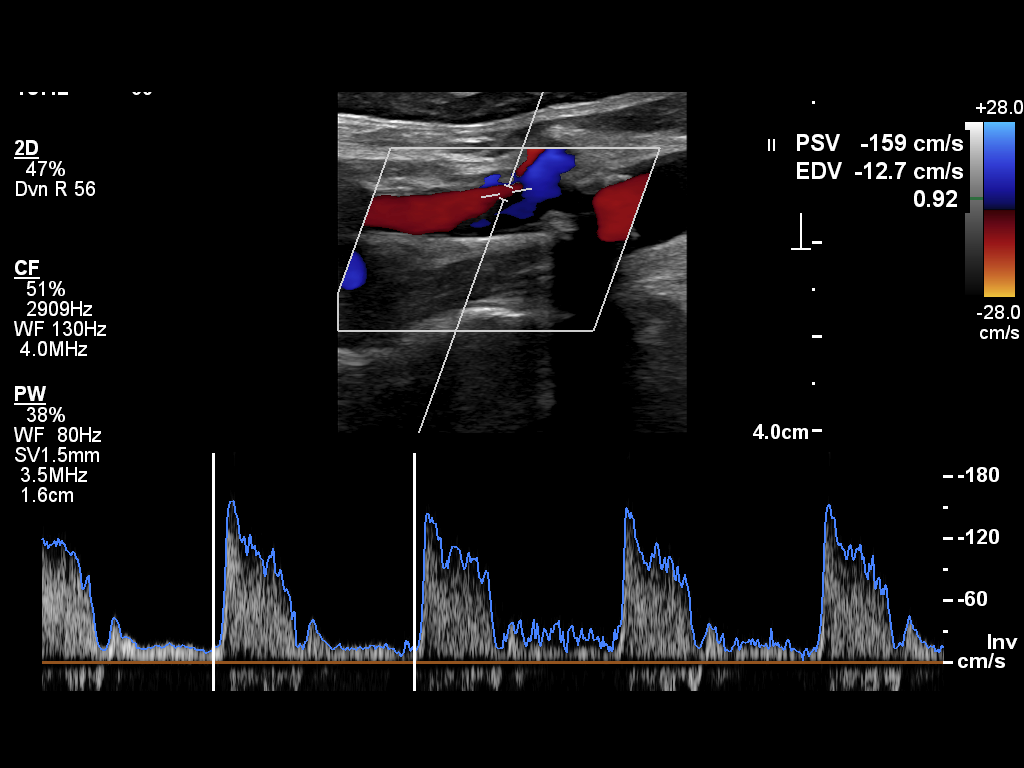
[im 5/7]
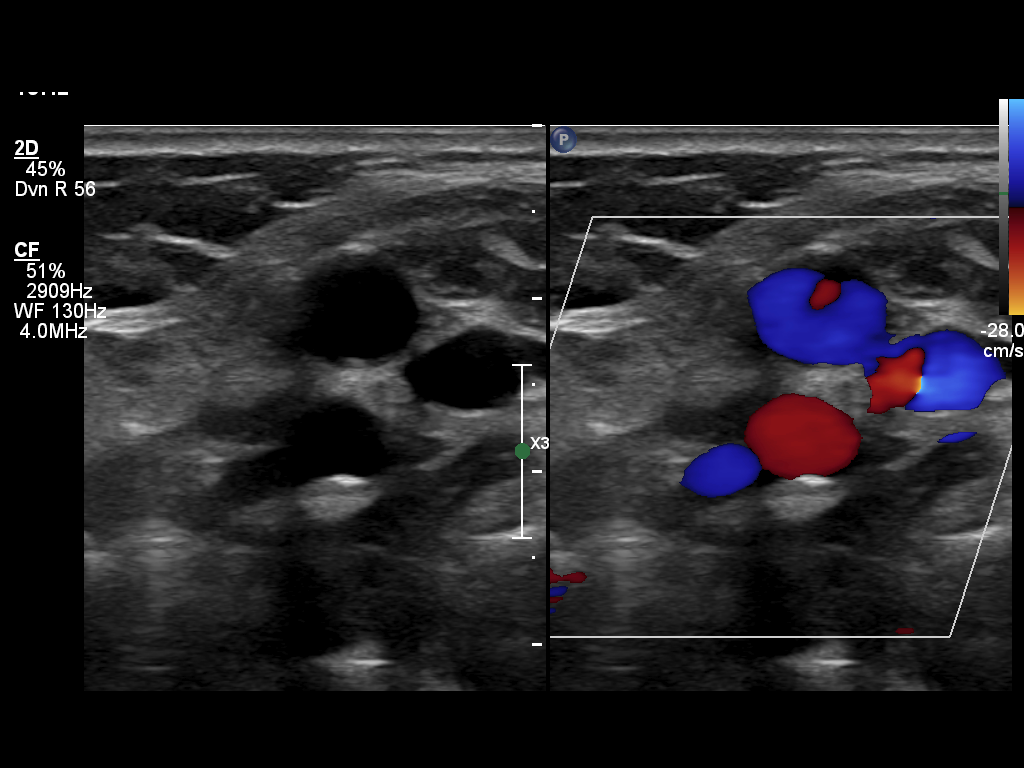
[im 6/7]
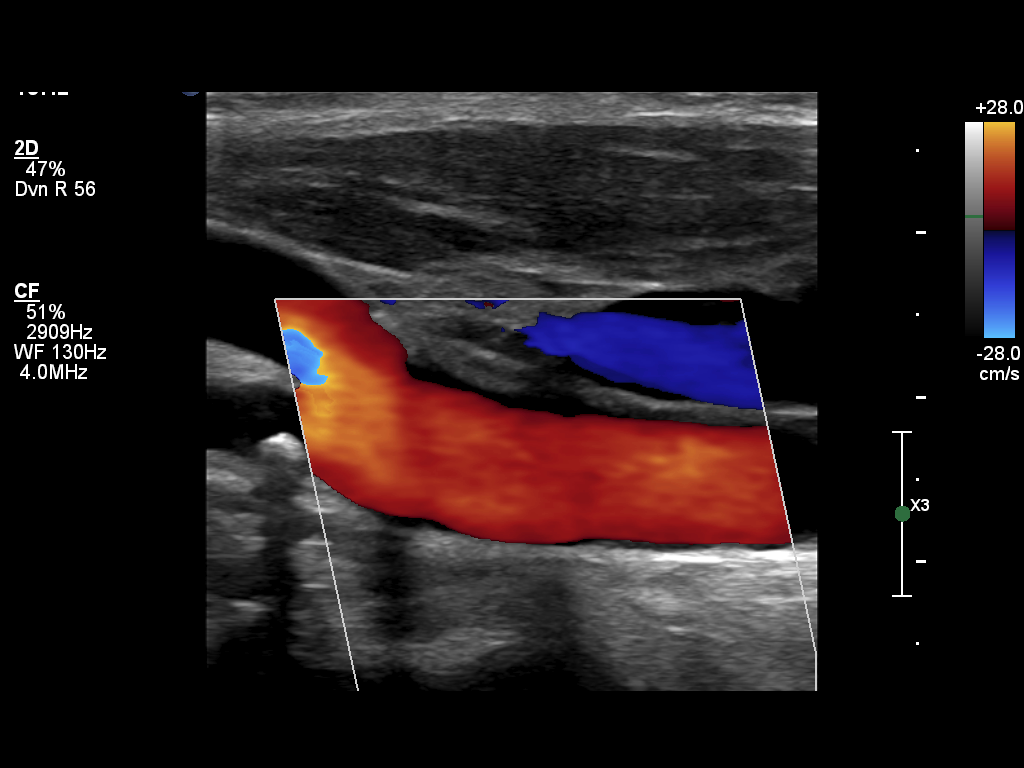
[im 7/7]
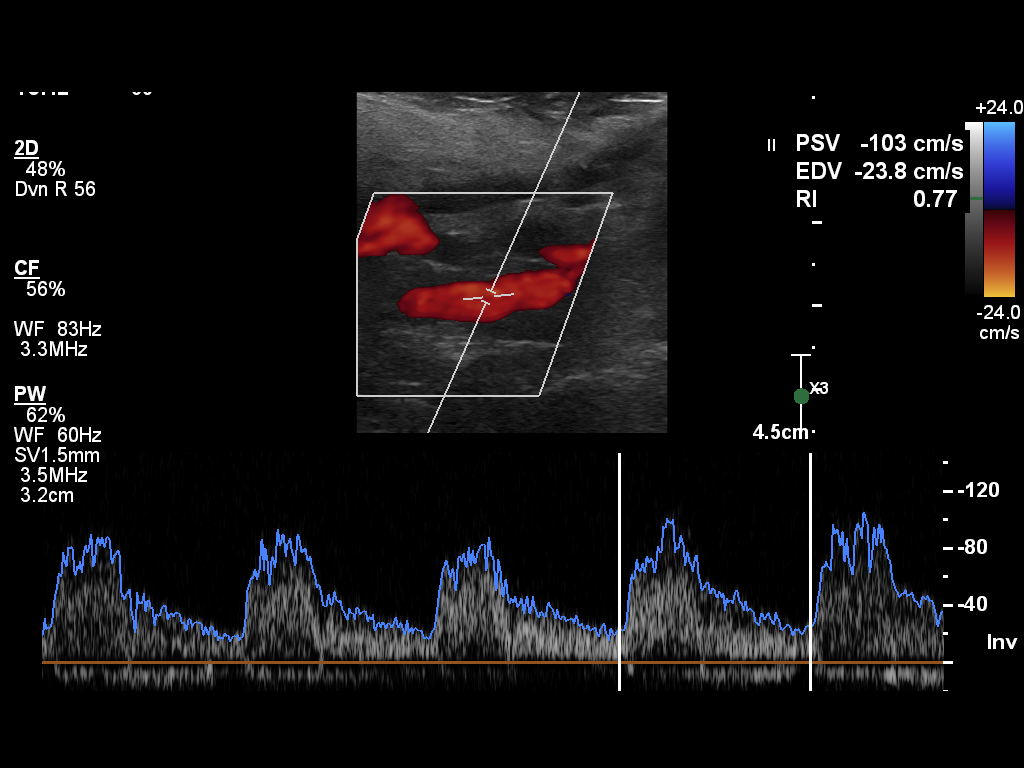

[7 of 7 positions shown; findings below may reference images not displayed]

EXAM

US CAROTID

INDICATION

CAROTID EVALUATION

TECHNIQUE

Grayscale and color doppler evaluation with spectral analysis of the carotids was performed

COMPARISONS

None available at the time of dictation.

FINDINGS

RIGHT:

Grayscale evaluation of plaque: Predominately calcified plaque at the carotid bulb extending into
the proximal ICA.

CCA: 91.7 cm/s

ICA: 113 cm/s

ECA: 183 cm/s

Vertebral artery: 52.8 cm/s, forward flow

ICA/CCA ratio:

LEFT:

Grayscale evaluation of plaque: Predominately calcified plaque at the carotid bulb extending into
the proximal ICA.

CCA: 76.8 cm/s

ICA: 103 cm/s

ECA: 78.2 cm/s

Vertebral artery: 54.4 cm/s, forward flow

ICA/CCA ratio:

IMPRESSION
1. No hemodynamically significant stenosis of the right carotid system.
2. No hemodynamically significant stenosis of the left carotid system.

Tech Notes:

## 2021-02-13 ENCOUNTER — Encounter: Admit: 2021-02-13 | Discharge: 2021-02-13 | Payer: MEDICARE

## 2021-07-23 IMAGING — MR L-spine^Routine
5 series · 34 of 48 positions shown · non-contrast
Comparison: none

[Series 2: T2 · sagittal · 4.0mm · 0.62mm/px · 6 of 13 slices shown (1 of 2)]
[im 1/13]
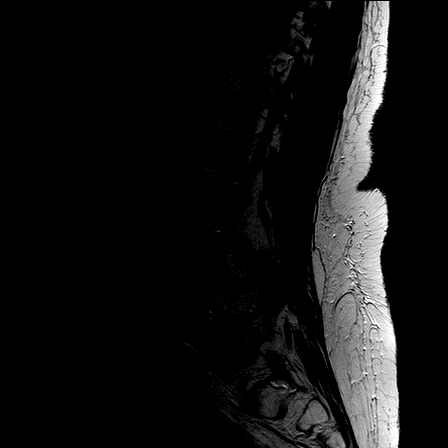
[im 3/13]
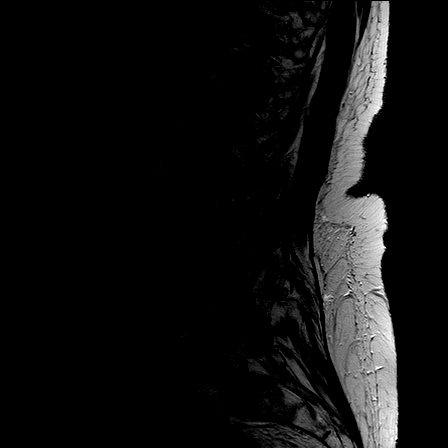
[im 5/13]
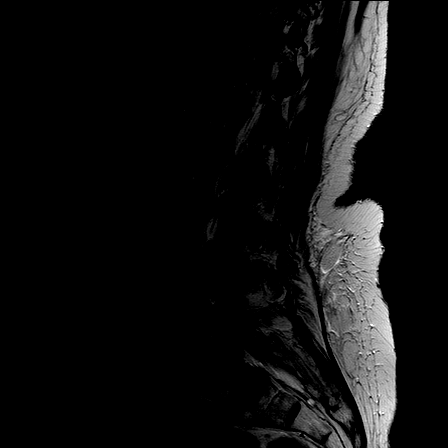
[im 8/13]
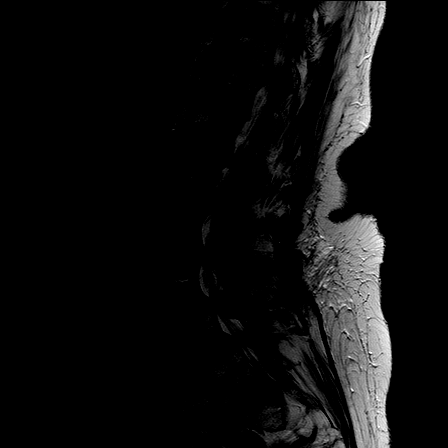
[im 10/13]
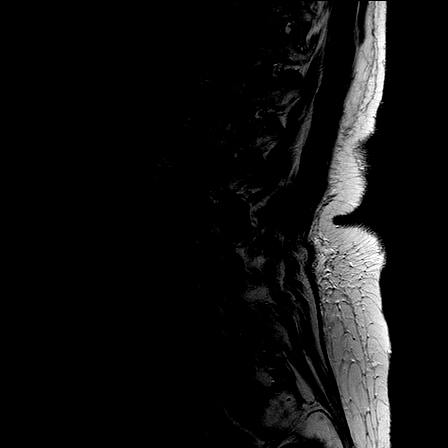
[im 13/13]
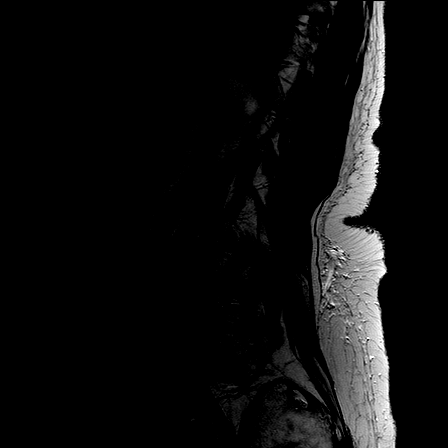

[Series 3: T1 · sagittal · 4.0mm · 0.73mm/px · 6 of 13 slices shown (1 of 2)]
[im 1/13]
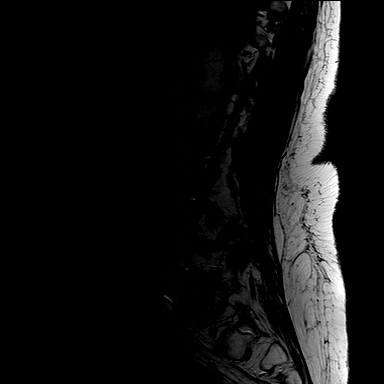
[im 3/13]
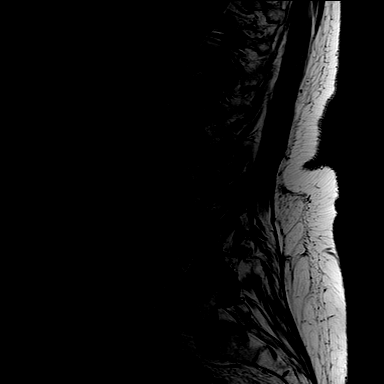
[im 5/13]
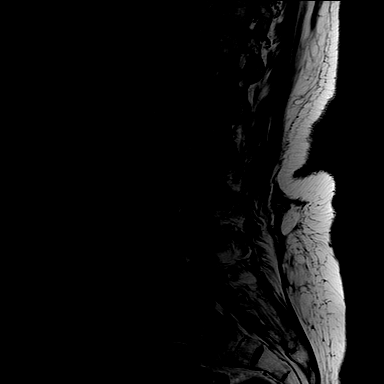
[im 8/13]
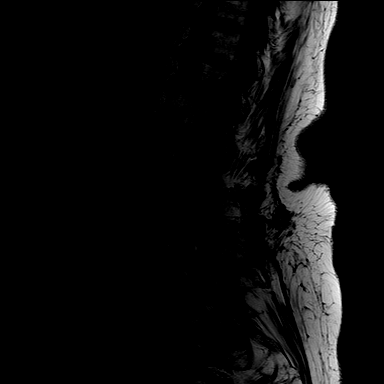
[im 10/13]
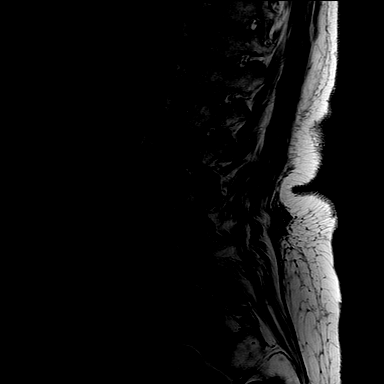
[im 13/13]
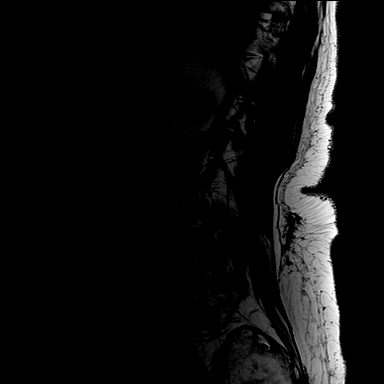

[Series 4: STIR · sagittal · 4.0mm · 0.55mm/px · 4 of 13 slices shown]
[im 1/13]
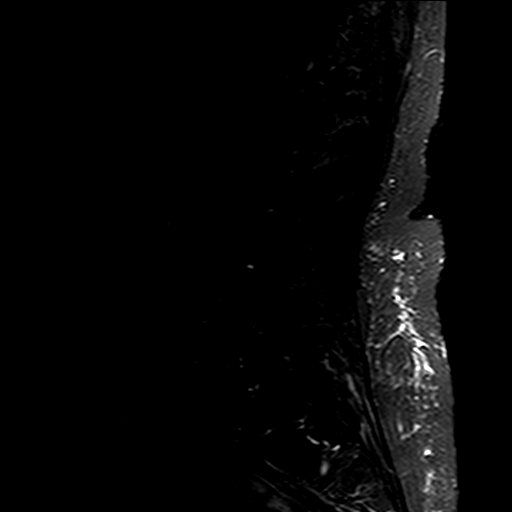
[im 3/13]
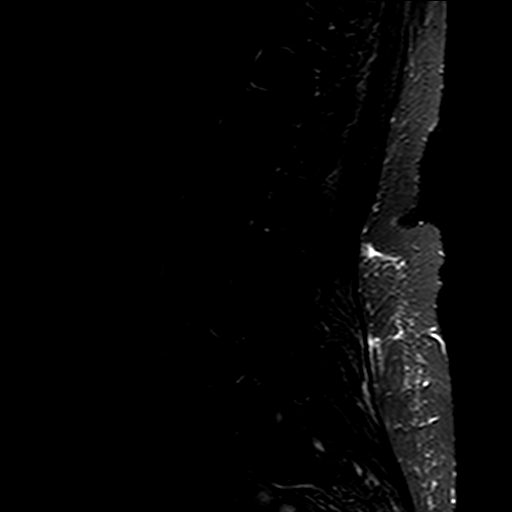
[im 5/13]
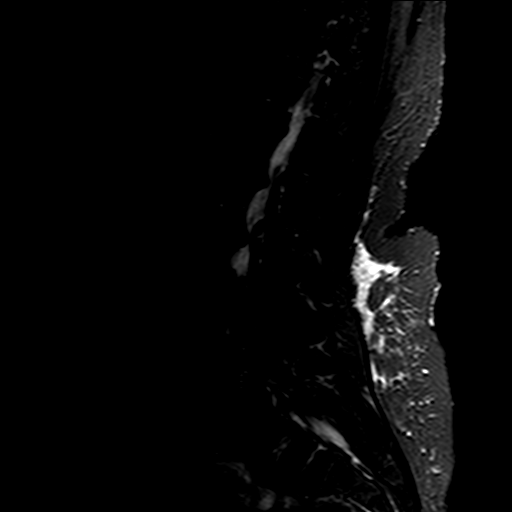
[im 8/13]
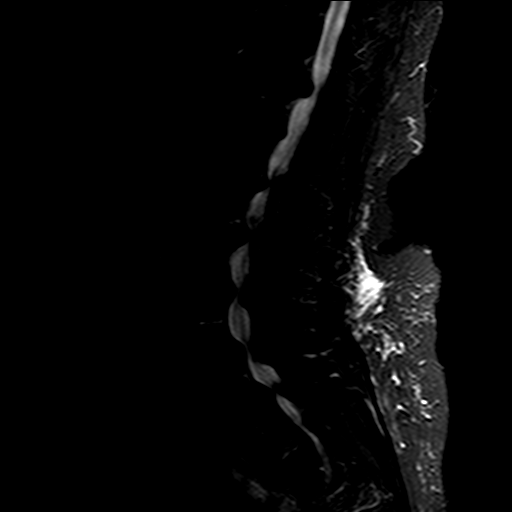

[Series 5: T2 · oblique · 4.5mm · 0.49mm/px · 9 of 33 slices shown (2 of 2)]
[im 1/33]
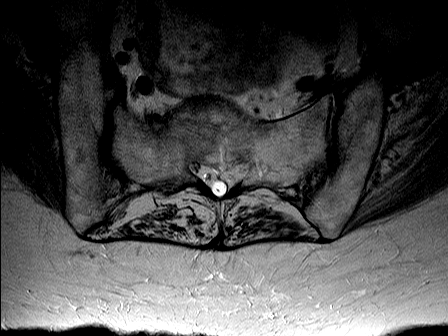
[im 5/33]
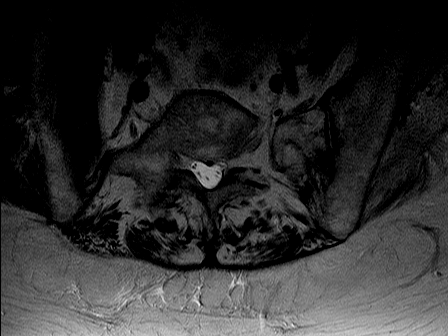
[im 10/33]
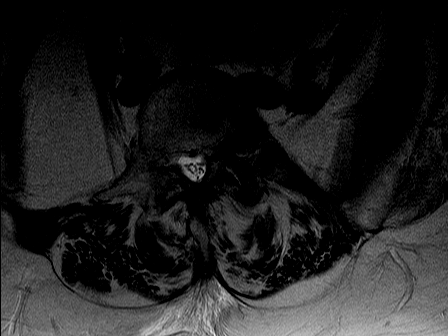
[im 14/33]
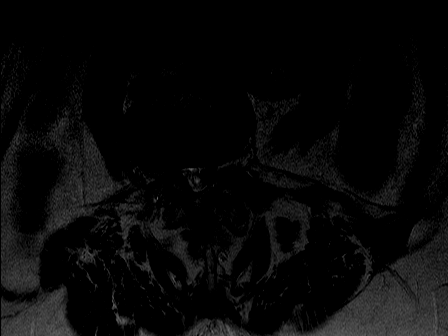
[im 17/33]
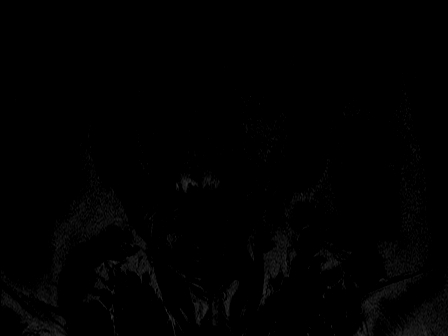
[im 19/33]
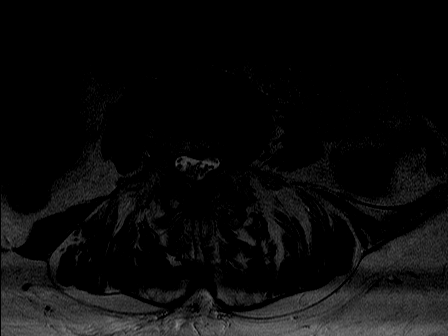
[im 23/33]
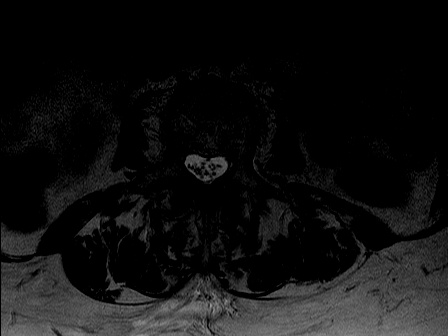
[im 28/33]
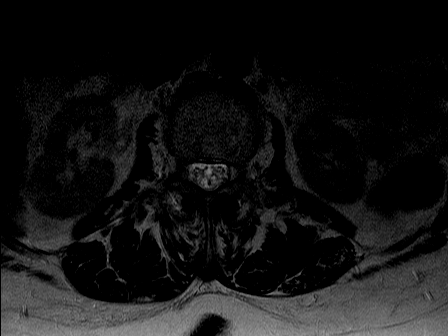
[im 33/33]
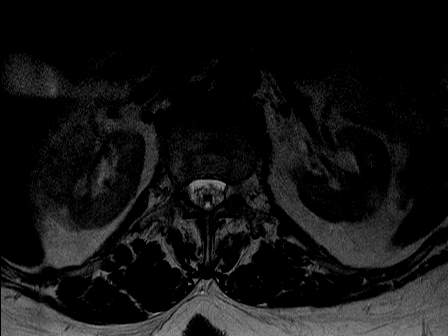

[Series 6: T1 · oblique · 4.5mm · 0.86mm/px · 9 of 33 slices shown (2 of 2)]
[im 1/33]
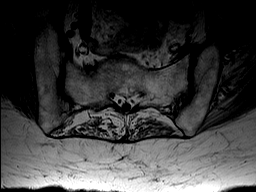
[im 5/33]
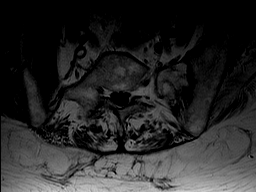
[im 10/33]
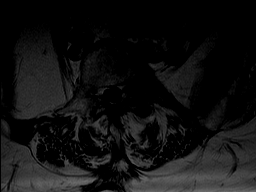
[im 14/33]
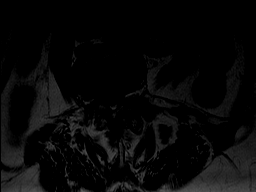
[im 17/33]
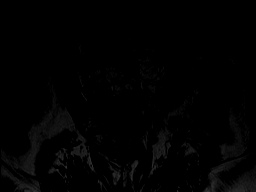
[im 19/33]
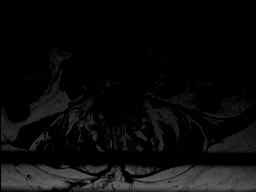
[im 23/33]
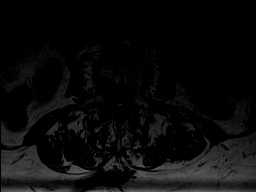
[im 28/33]
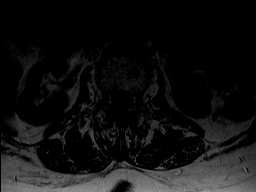
[im 33/33]
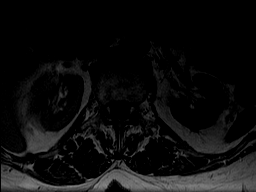

[34 of 48 positions shown; findings below may reference images not displayed]

EXAM

MR lumbar spine wo con

INDICATION

lumbar spinal stenosis
rt sided hip pain, worse with walking.  rg

TECHNIQUE

MR lumbar spine wo con

COMPARISONS

None available

FINDINGS

Conus medullaris tip terminates at L1-L2. Normal cord and marrow signal.

T12-L1: Disc bulge and facet osteoarthritis with moderate central canal narrowing. Mild right
neural foraminal narrowing.

L1-L2: Disc bulge and facet osteoarthritis with severe central canal narrowing. Moderate right
neural foraminal narrowing.

L2-L3: Disc bulge and facet osteoarthritis with severe central canal narrowing. Mild right and
moderate to severe left neural foraminal narrowing.

L3-L4: Grade 1 anterolisthesis of L4 on L5. Slight disc bulge and left predominant osteophytes
causing moderate central canal narrowing. Mild bilateral neural foraminal narrowing.

L4-L5: Grade 1 anterolisthesis of L4 on L5. Disc bulge and facet osteoarthritis with severe central
canal narrowing. Moderate bilateral neural foraminal narrowing.

L5-S1: No central canal or neural foraminal stenosis. Next

There are no acute findings in the extra vertebral soft tissues.

IMPRESSION

Extensive degenerative changes with severe multilevel stenosis as above.

Tech Notes:

rt sided hip pain, worse with walking.  rg

## 2021-08-06 IMAGING — RF FL guided spine inject
1 series · 6 of 6 positions shown · non-contrast
Comparison: none

[Series 1: run · 3 acquisitions, 6 frames shown]
[im 1/3]
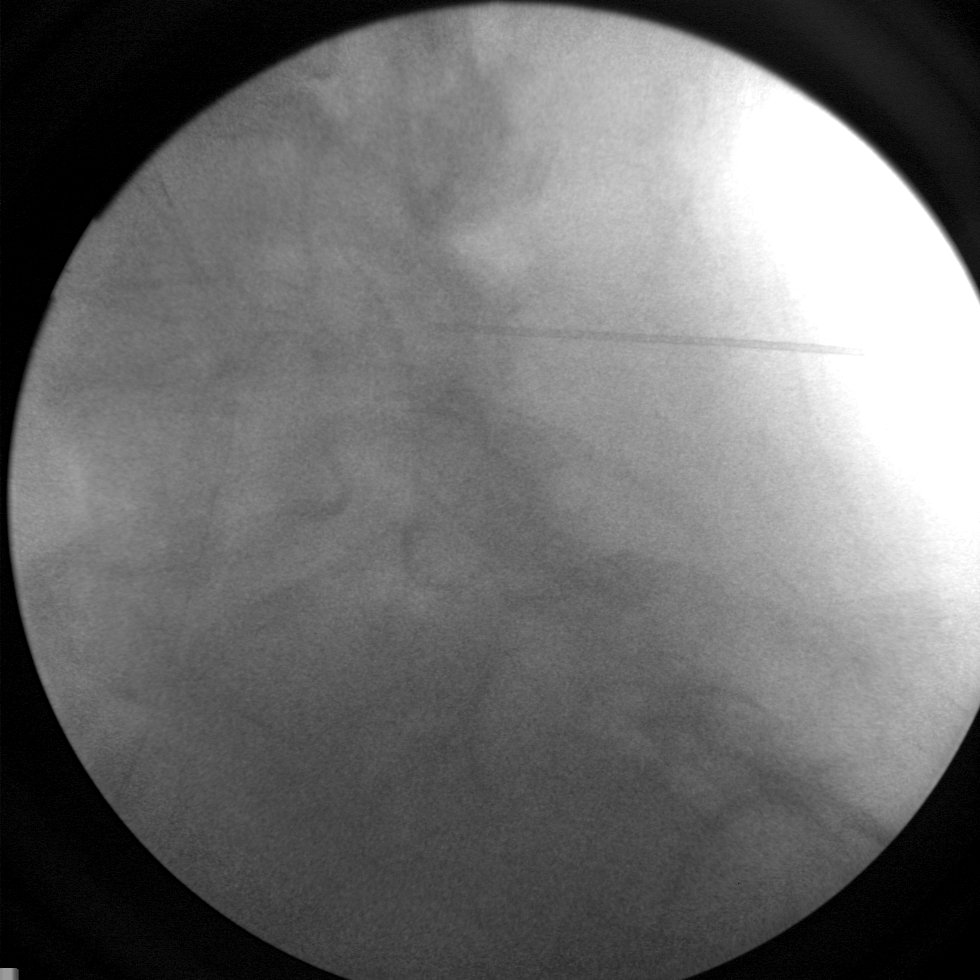
[im 2/3]
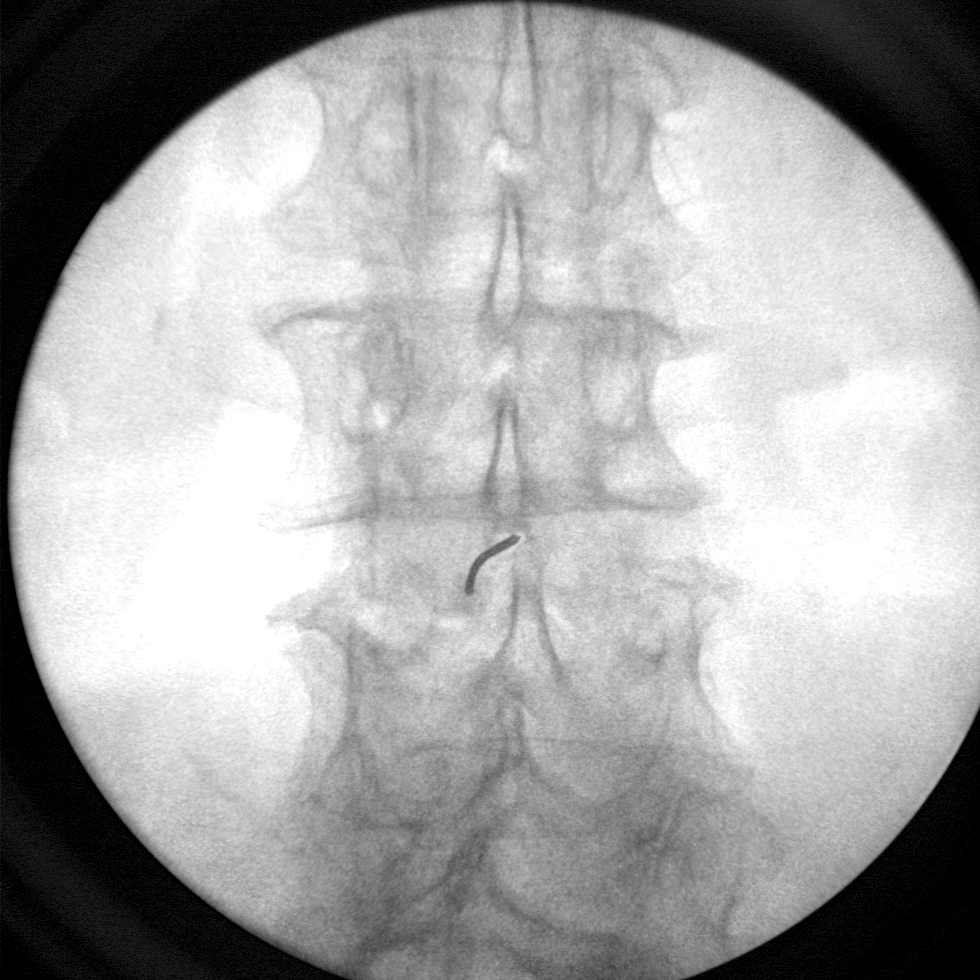
[im 3/3]
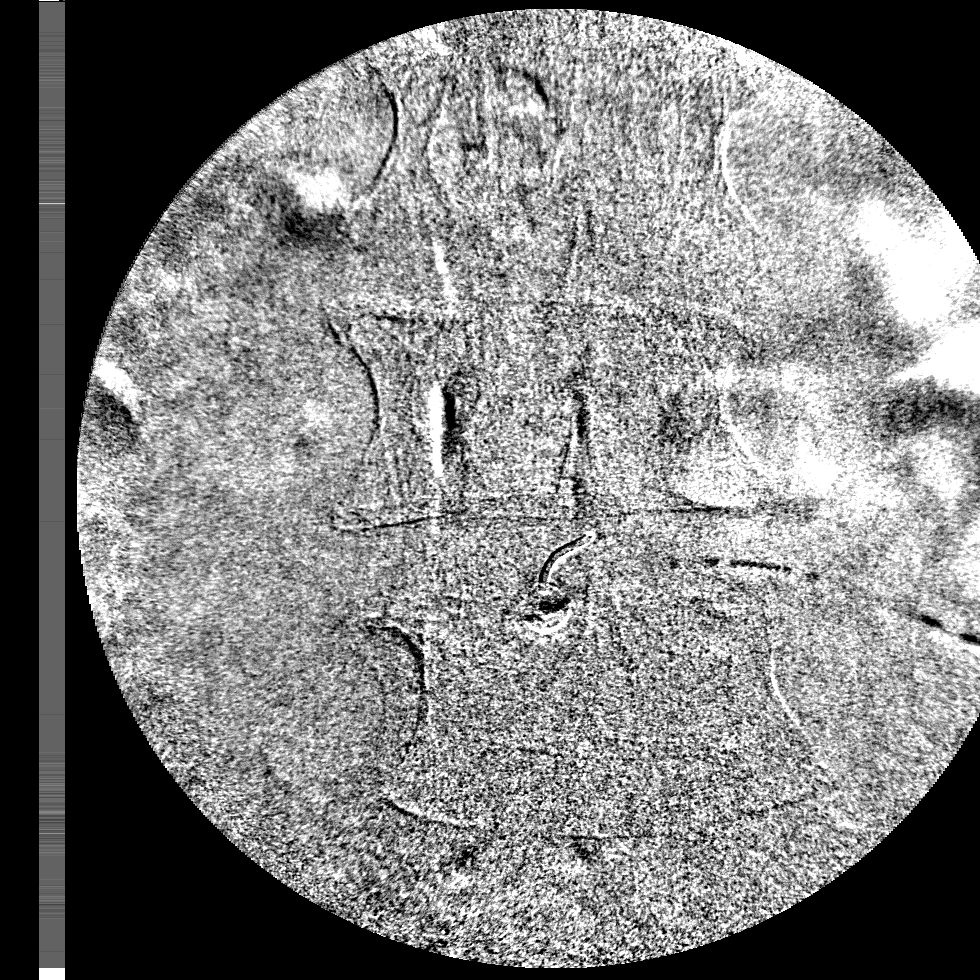
[im 3/3]
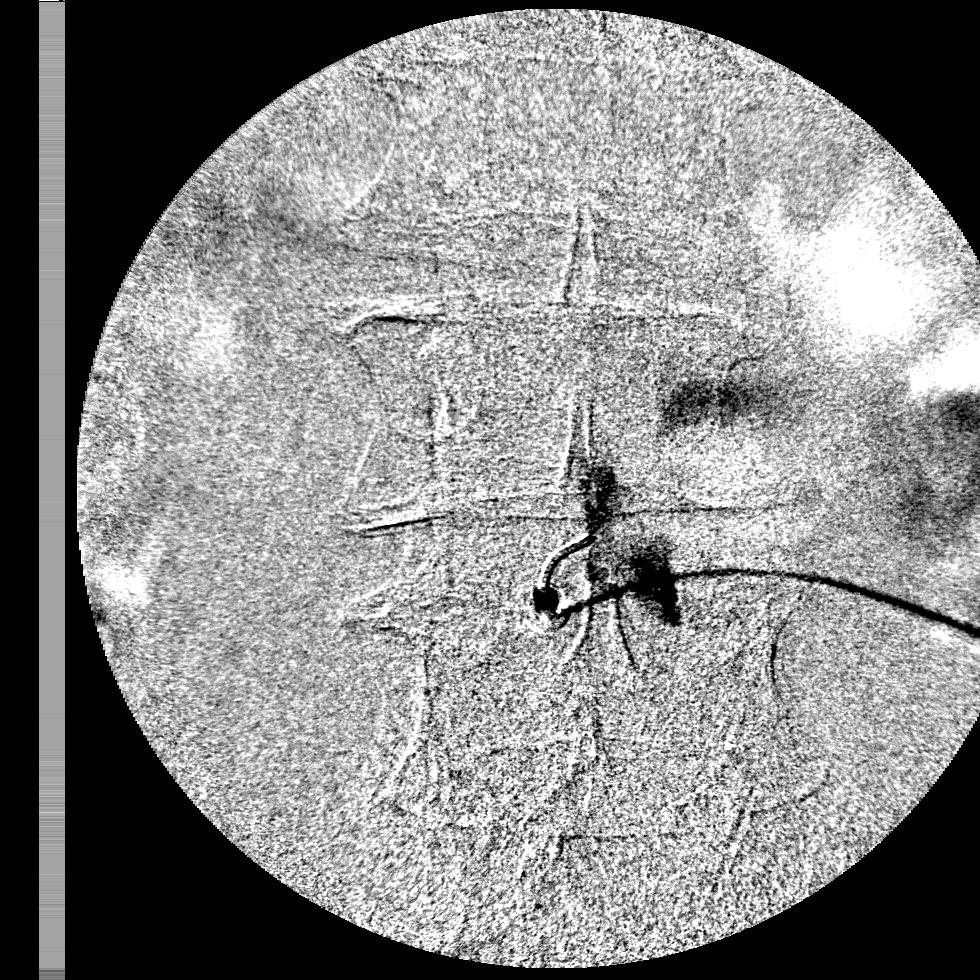
[im 3/3]
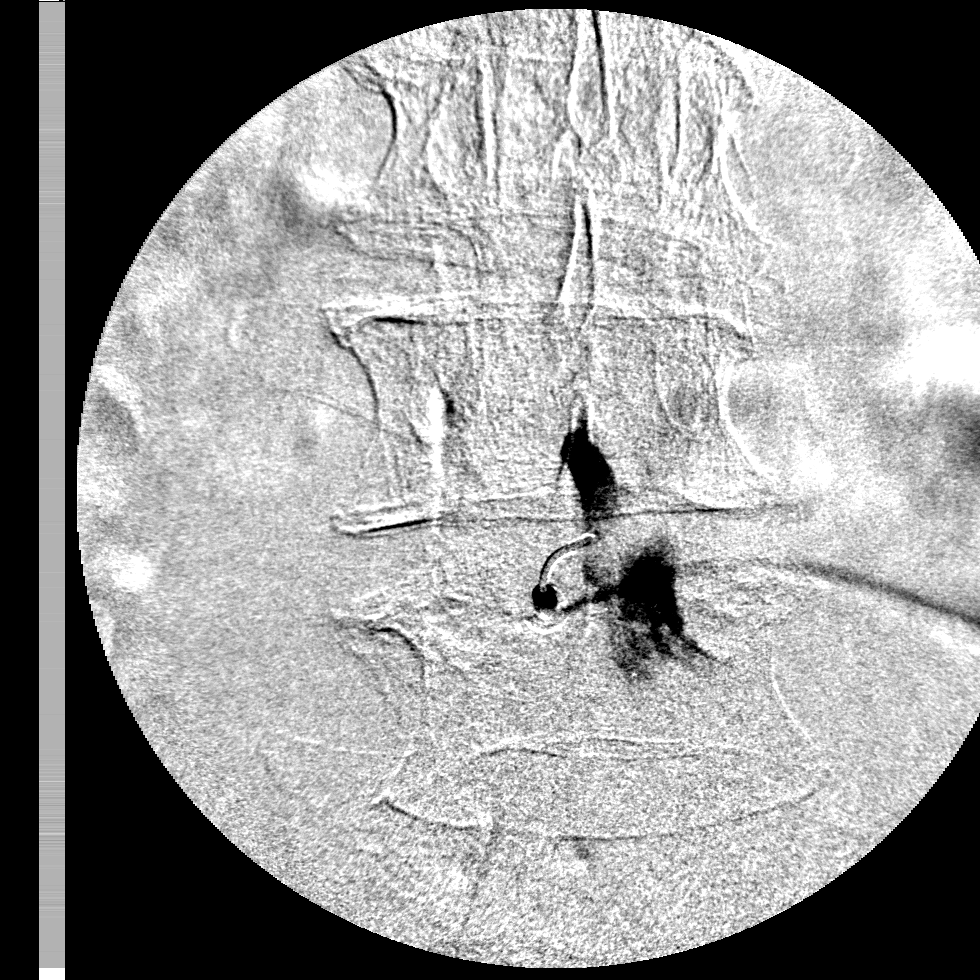
[im 3/3]
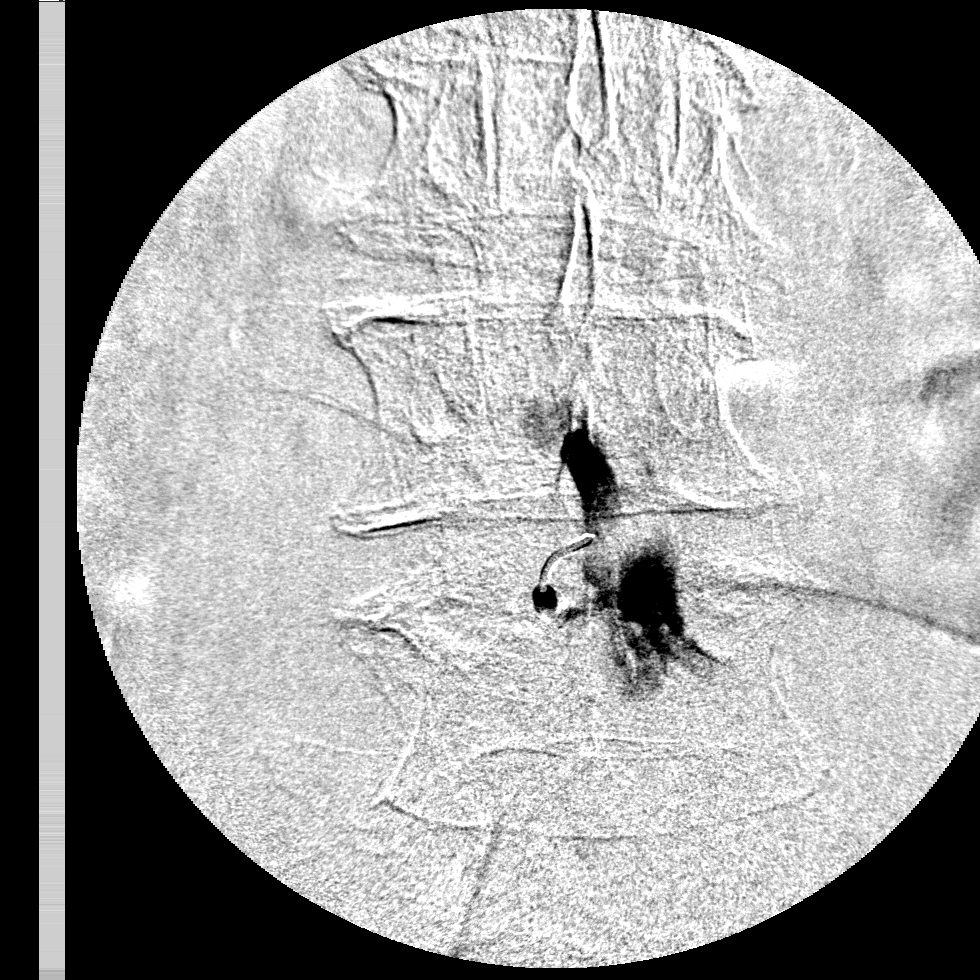

[6 of 6 positions shown; findings below may reference images not displayed]

EXAM

FL guided spine inject

INDICATION

FAUREAN
FAUREAN, OR2, Dr Gravelle, Fluoro time 17.4 secs, 4.03 mGy, 3 images.CS

TECHNIQUE

FLUOROSCOPIC TIME: 11.1 seconds

IMAGES: 3

COMPARISONS

None available at the time of dictation.

FINDINGS

Successful fluoroscopic guidance.

IMPRESSION
1. Successful fluoroscopic guidance.

Tech Notes:

FAUREAN, OR2, Dr Gravelle, Fluoro time 17.4 secs, 4.03 mGy, 3 images.CS

## 2021-08-07 ENCOUNTER — Encounter: Admit: 2021-08-07 | Discharge: 2021-08-07 | Payer: MEDICARE

## 2021-08-13 ENCOUNTER — Encounter: Admit: 2021-08-13 | Discharge: 2021-08-13 | Payer: MEDICARE

## 2021-08-13 DIAGNOSIS — R55 Syncope and collapse: Secondary | ICD-10-CM

## 2021-08-13 DIAGNOSIS — I351 Nonrheumatic aortic (valve) insufficiency: Secondary | ICD-10-CM

## 2021-08-13 DIAGNOSIS — R0989 Other specified symptoms and signs involving the circulatory and respiratory systems: Secondary | ICD-10-CM

## 2021-08-13 DIAGNOSIS — R5383 Other fatigue: Secondary | ICD-10-CM

## 2021-08-13 DIAGNOSIS — I2510 Atherosclerotic heart disease of native coronary artery without angina pectoris: Secondary | ICD-10-CM

## 2021-08-13 DIAGNOSIS — R0600 Dyspnea, unspecified: Secondary | ICD-10-CM

## 2021-08-13 DIAGNOSIS — R011 Cardiac murmur, unspecified: Secondary | ICD-10-CM

## 2021-08-13 DIAGNOSIS — E785 Hyperlipidemia, unspecified: Secondary | ICD-10-CM

## 2021-08-13 DIAGNOSIS — E7849 Other hyperlipidemia: Secondary | ICD-10-CM

## 2021-08-13 DIAGNOSIS — I10 Essential (primary) hypertension: Secondary | ICD-10-CM

## 2021-08-13 DIAGNOSIS — Z136 Encounter for screening for cardiovascular disorders: Secondary | ICD-10-CM

## 2021-08-13 NOTE — Progress Notes
Date of Service: 08/13/2021    Brianna Christensen is a 81 y.o. female.       HPI      Brianna Christensen is a 81 y.o. 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    From a cardiac standpoint this patient remained stable, she does not have any symptoms of chest pain, no heart palpitation, she has not taken any sublingual nitroglycerin.    The main health issue right now pertains to osteoarthritis, bilateral hip pain and low back pain, she does use a walker for ambulation.  Patient has not undergone a formal orthopedic evaluation, she did receive cortisone shots in her hip and lower back, she continues to have pain.    In our office today her blood pressure and heart rate were under good control.  She continues to take a combination of a beta-blocker and an ACE inhibitor, she does take rosuvastatin for hyperlipidemia and a known coronary artery calcifications.         Vitals:    08/13/21 1342 08/13/21 1351   BP: 124/56 112/60   BP Source: Arm, Left Upper Arm, Right Upper   Pulse: 80    SpO2: 97%    O2 Percent: 97 %    O2 Device: None (Room air)    PainSc: Four    Weight: 82.3 kg (181 lb 6.4 oz)    Height: 168.9 cm (5' 6.5)      Body mass index is 28.84 kg/m?Marland Kitchen     Past Medical History  Patient Active Problem List    Diagnosis Date Noted   ? Bilateral carotid bruits 02/05/2021   ? Hospital discharge follow-up 08/07/2020   ? 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 Coronary artery calcification 05/18/2017     05/03/17 - CCTA  at [facility] - Mild to moderate areas of mixed plaquing noted. Though FFR assessment suggested no significant obstructive coronary artery disease.  03/18/17 - MPI Stress Test at [facility] - 1. This study is probably normal with mild intensity attenuation in the anterior wall.  All segments are viable global left ventricular function is within normal limits other high risk indicators are not noted.  The patient has poor exercise capacity which placed the patient intermediate risk category but in light of the attenuation artifact anteriorly no definite regional perfusion defects are noted.  If the clinical suspicion of coronary disease is intermediate consider coronary CTA with FFR.       ? Essential hypertension 03/07/2017     Echo - 01/09/18 at [facility] - 1. Normal LV size with concentric remodeling and normal systolic function, EF ~ 60%;  No regional wall motion abnormalities. 2. Diastolic function is technically indeterminate.Normal size atria bilaterally 3. Aortic sclerosis without stenosis.  Mild to moderate regurgitation; 4. Visualized portion of the aortic root is within normal limits according to patient's body surface area. 5. Estimated peak systolic PA pressure =  24 mmHg; 6. No pericardial effusion  ?     ? Fatigue 03/04/2017   ? Hyperlipidemia 03/04/2017     03/18/17 - MPI Stress Test at [facility] - 1. This study is probably normal with mild intensity attenuation in the anterior  wall.  All segments are viable global left ventricular function is within normal limits other high risk indicators are not noted.  The patient has poor exercise capacity which placed the patient intermediate risk category but in light of the attenuation artifact anteriorly no definite regional perfusion defects are noted.  If the clinical suspicion of coronary disease is intermediate consider coronary CTA with FFR.     ? Near syncope 03/04/2017   ? Murmur 03/03/2017     02/25/2017 Echo at [facility]  Mitral valve leaflets appear mildly thickened.  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  The mitral regurgitation jet was central. 2.  Trileaflet aortic valve.  The aortic cusps appear mildly thickened.  Aortic cusps appear mildly calcified.  The peak transaortic gradient was 17.00 mmHg.  The mean transaortic gradient was 7.00 mmHg.  Moderate aortic valve regurgitation.  Normal LV systolic function.  LV EF 62%.  Normal left ventricular wall thickness.            Review of Systems   Constitutional: Negative.   HENT: Negative.    Eyes: Negative.    Cardiovascular: Negative.    Respiratory: Negative.    Endocrine: Negative.    Hematologic/Lymphatic: Negative.    Skin: Positive for rash.   Musculoskeletal: Positive for back pain, joint pain and myalgias.   Gastrointestinal: Negative.    Genitourinary: Negative.    Neurological: Negative.    Psychiatric/Behavioral: Negative.    Allergic/Immunologic: Negative.        Physical Exam  General Appearance: normal in appearance  Skin: warm, moist, no ulcers or xanthomas  Eyes: conjunctivae and lids normal, pupils are equal and round  Lips & Oral Mucosa: no pallor or cyanosis  Neck Veins: neck veins are flat, neck veins are not distended  Chest Inspection: chest is normal in appearance  Respiratory Effort: breathing comfortably, no respiratory distress  Auscultation/Percussion: lungs clear to auscultation, no rales or rhonchi, no wheezing  Cardiac Rhythm: regular rhythm and normal rate  Cardiac Auscultation: S1, S2 normal, no rub, no gallop  Murmurs: no murmur  Carotid Arteries: normal carotid upstroke bilaterally, no bruit  Lower Extremity Edema: no lower extremity edema  Abdominal Exam: soft, non-tender, no masses, bowel sounds normal  Liver & Spleen: no organomegaly  Language and Memory: patient responsive and seems to comprehend information  Neurologic Exam: neurological assessment grossly intact      Cardiovascular Studies  Twelve-lead EKG demonstrates normal sinus rhythm, no ST segment T wave changes, no axis deviation, ventricular rate is 79 bpm, normal PR/QT/QTc intervals    Cardiovascular Health Factors  Vitals BP Readings from Last 3 Encounters:   08/13/21 112/60   02/05/21 132/68   08/07/20 (!) 160/86     Wt Readings from Last 3 Encounters:   08/13/21 82.3 kg (181 lb 6.4 oz)   02/05/21 80.3 kg (177 lb)   08/07/20 79.2 kg (174 lb 9.6 oz)     BMI Readings from Last 3 Encounters:   08/13/21 28.84 kg/m?   02/05/21 28.14 kg/m?   08/07/20 27.76 kg/m?      Smoking Social History     Tobacco Use   Smoking Status Never   Smokeless Tobacco Never      Lipid Profile Cholesterol   Date Value Ref Range Status   02/06/2021 162  Final     HDL   Date Value Ref Range Status   02/06/2021 62  Final     LDL   Date Value Ref Range Status  02/06/2021 77  Final     Triglycerides   Date Value Ref Range Status   02/06/2021 115  Final      Blood Sugar No results found for: HGBA1C  Glucose   Date Value Ref Range Status   06/24/2021 103  Final   01/20/2021 92  Final   07/11/2020 106 (H) 70 - 105 Final          Problems Addressed Today  Encounter Diagnoses   Name Primary?   ? Coronary artery calcification Yes   ? Essential hypertension    ? Other hyperlipidemia    ? Near syncope    ? Nonrheumatic aortic valve insufficiency    ? Bilateral carotid bruits    ? Murmur    ? Screening for heart disease        Assessment and Plan     Assessment:    1.  Bilateral carotid bruits - patient was evaluated with a carotid artery duplex in July 2022, she was not found to have any obstructive coronary artery disease  2.  Coronary artery calcification  ? She did undergo FFR study by a coronary CTA and it was not significant  ? Patient was also evaluated with a perfusion imaging study in August 2018, the tomographic pattern was negative for ischemia  3.  Primary hypertension-she is on a beta-blocker and ACE inhibitor it is under good control  4.  Mild to moderate, nonrheumatic aortic valve insufficiency-this was last evaluated by an echocardiogram performed in June 2021, patient does not have any symptoms of increased shortness of breath  5.  Hyperlipidemia-on statin therapy  6.  Osteoarthritis with main involvement of the hips and lower back, patient does use a cane for ambulation    Plan:    1.  Continue all current medications  2.  I suggested further orthopedic evaluation with Dr. Crista Curb at [facility]  3.  Further evaluation with a 2D echocardiogram at patient's earliest convenience, we will follow-up on the results and will call with further recommendations  4.  Follow-up office visit in November - December 2023.      Total Time Today was 40 minutes in the following activities: Preparing to see the patient, Obtaining and/or reviewing separately obtained history, Performing a medically appropriate examination and/or evaluation, Counseling and educating the patient/family/caregiver, Ordering medications, tests, or procedures, Referring and communication with other health care professionals (when not separately reported), Documenting clinical information in the electronic or other health record, Independently interpreting results (not separately reported) and communicating results to the patient/family/caregiver and Care coordination (not separately reported)         Current Medications (including today's revisions)  ? acetaminophen (TYLENOL) 325 mg tablet Take 650 mg by mouth twice daily.   ? Calcium Carbonate 600 mg calcium (1,500 mg) tab Take 1 tablet by mouth twice daily.   ? CHOLEcalciferoL (vitamin D3) (VITAMIN D3) 1,000 units tablet Take 1,000 Units by mouth daily.   ? lisinopriL (ZESTRIL) 20 mg tablet Take 20 mg by mouth twice daily.   ? metoprolol tartrate (LOPRESSOR) 50 mg tablet Take 50 mg by mouth twice daily.   ? MULTIVITAMIN PO Take 1 tablet by mouth daily.   ? rosuvastatin (CRESTOR) 20 mg tablet Take one tablet by mouth daily.   ? Vit A,C & E-Lutein-Minerals 1,000 unit-200 mg-60 unit-2 mg tab Take 1 tablet by mouth daily.   ? Zinc 50 mg tab Take 2 tablets by mouth daily.

## 2021-11-17 IMAGING — CR PAPERORDER
4 series · 4 of 4 positions shown · non-contrast
Comparison: none

[lspine ap]
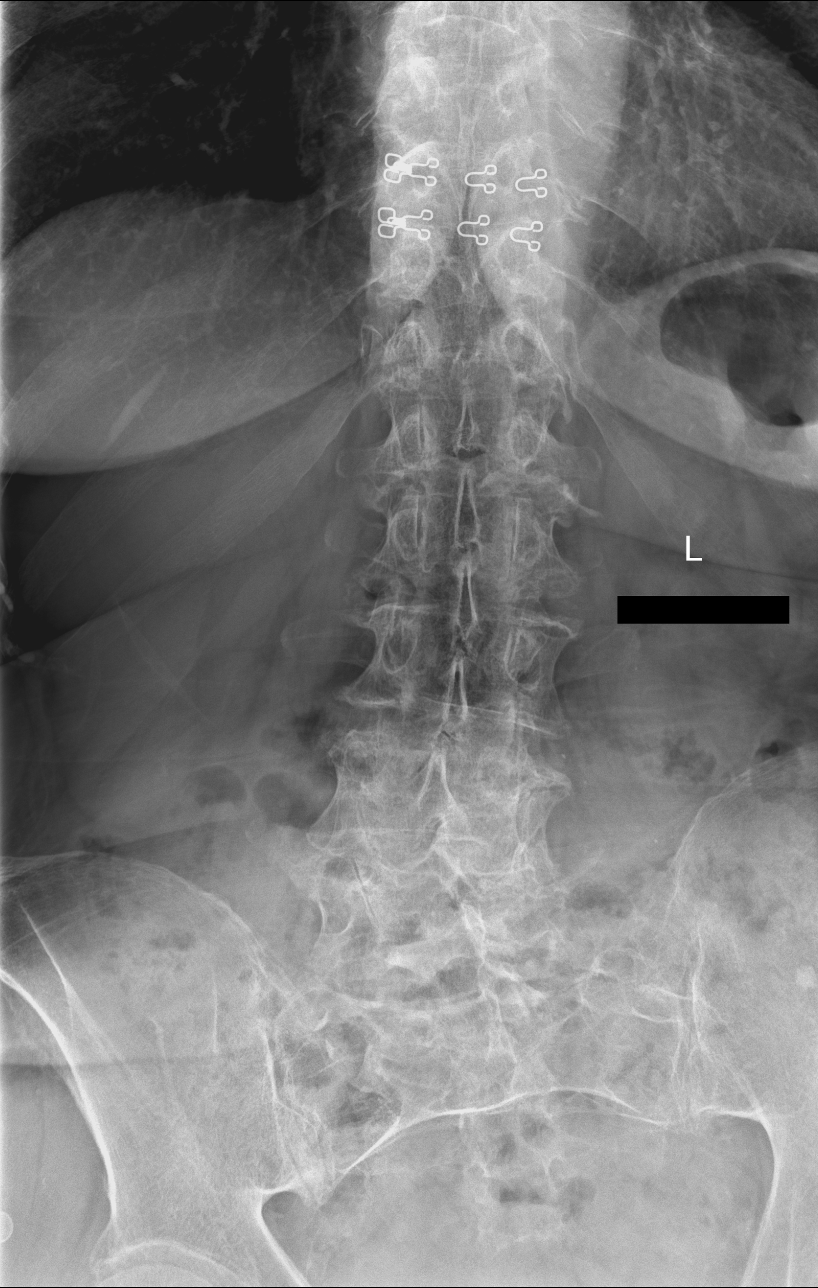

[lspine lat]
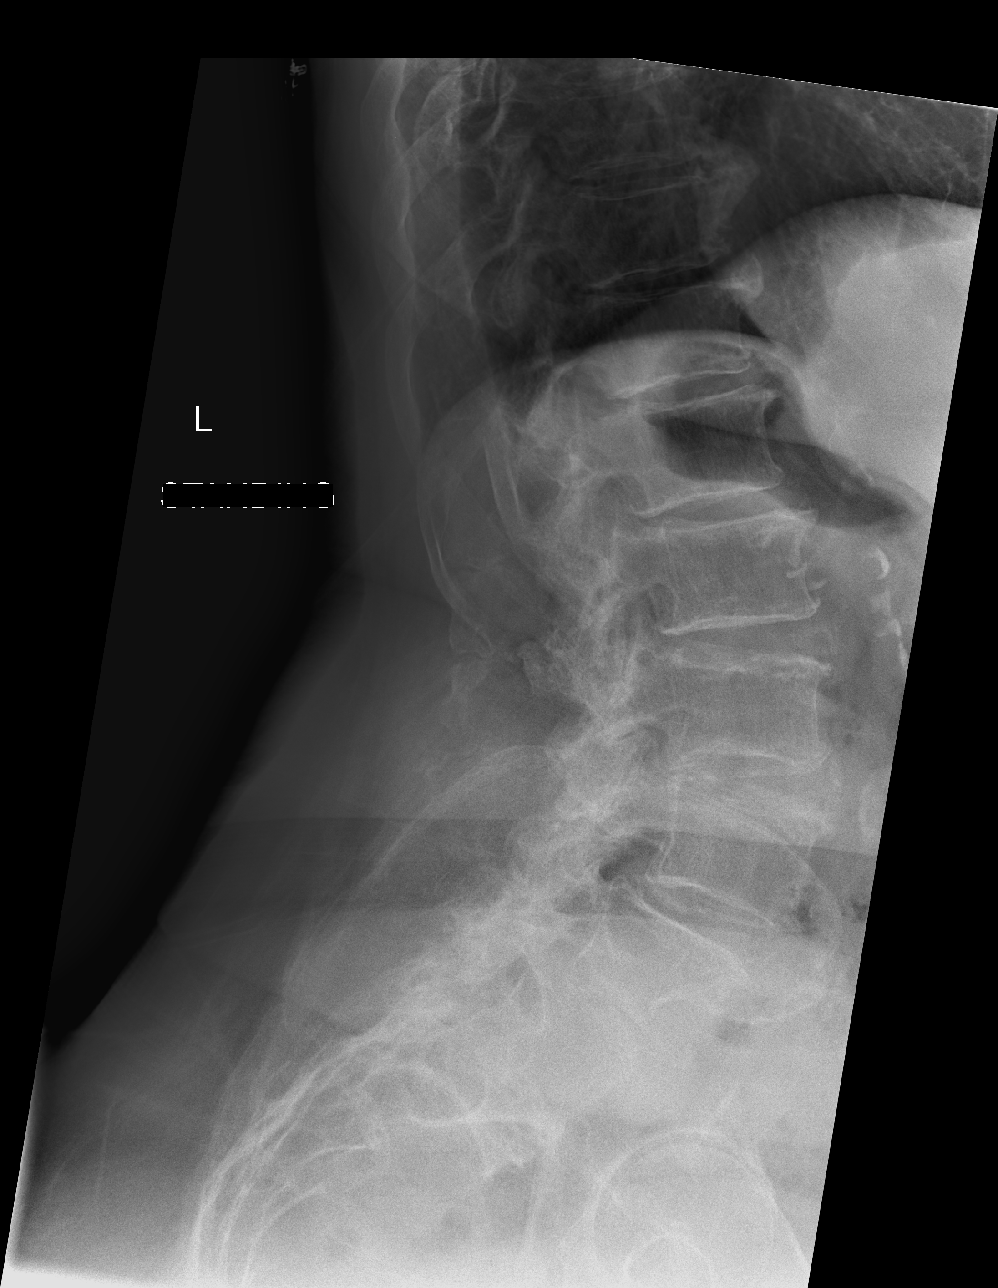

[lspine flexion]
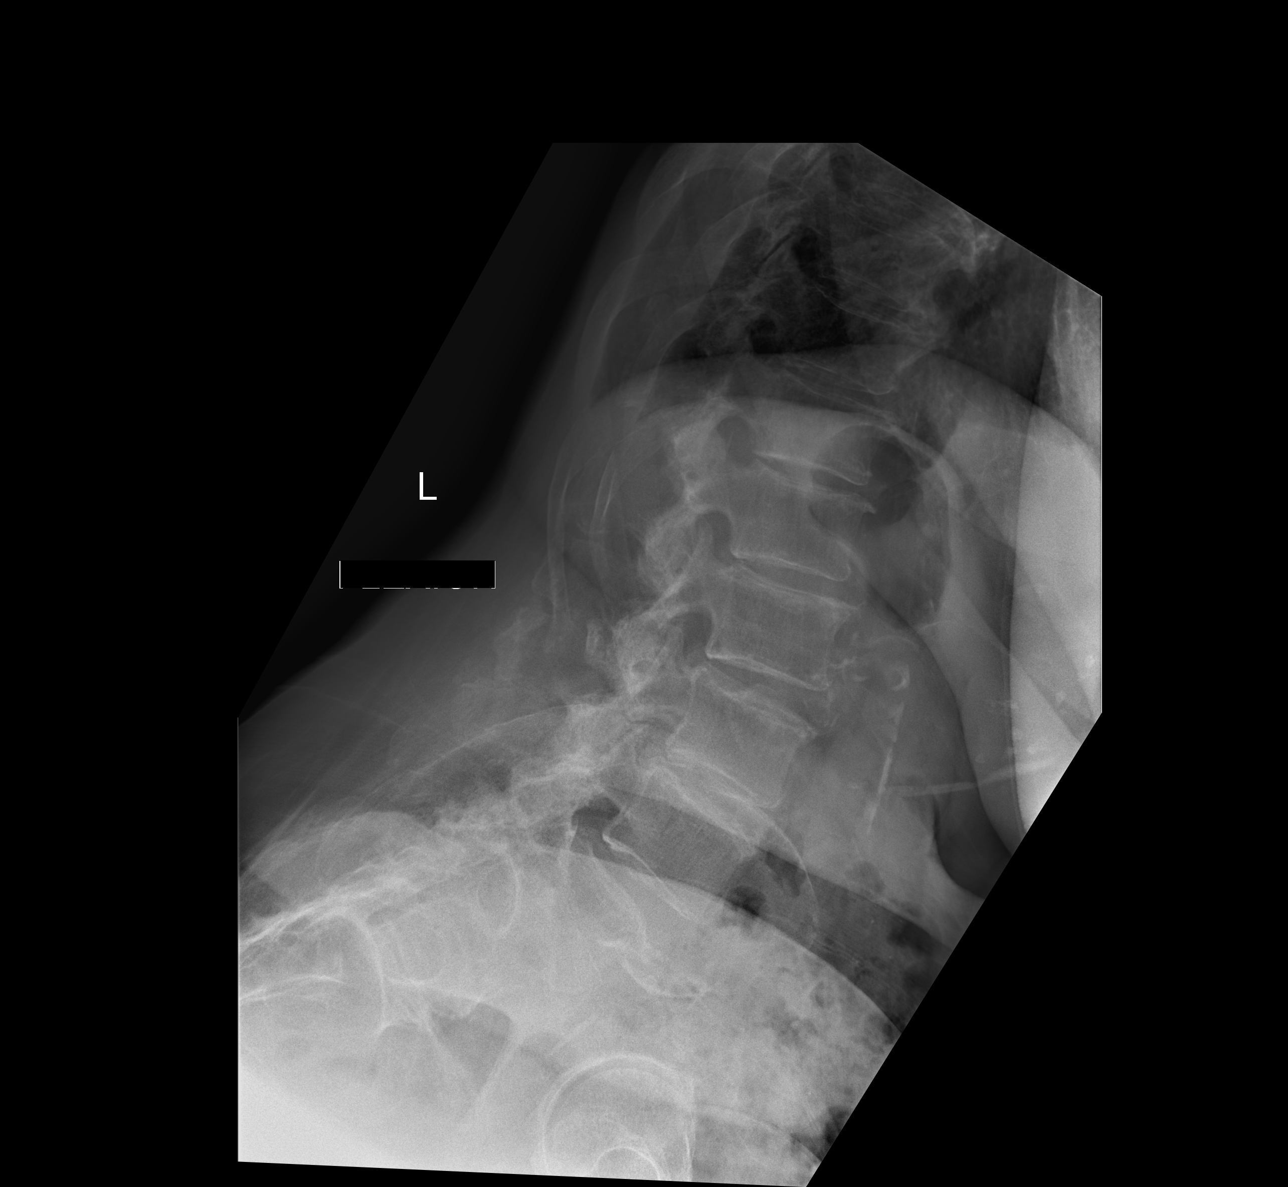

[lspine extension]
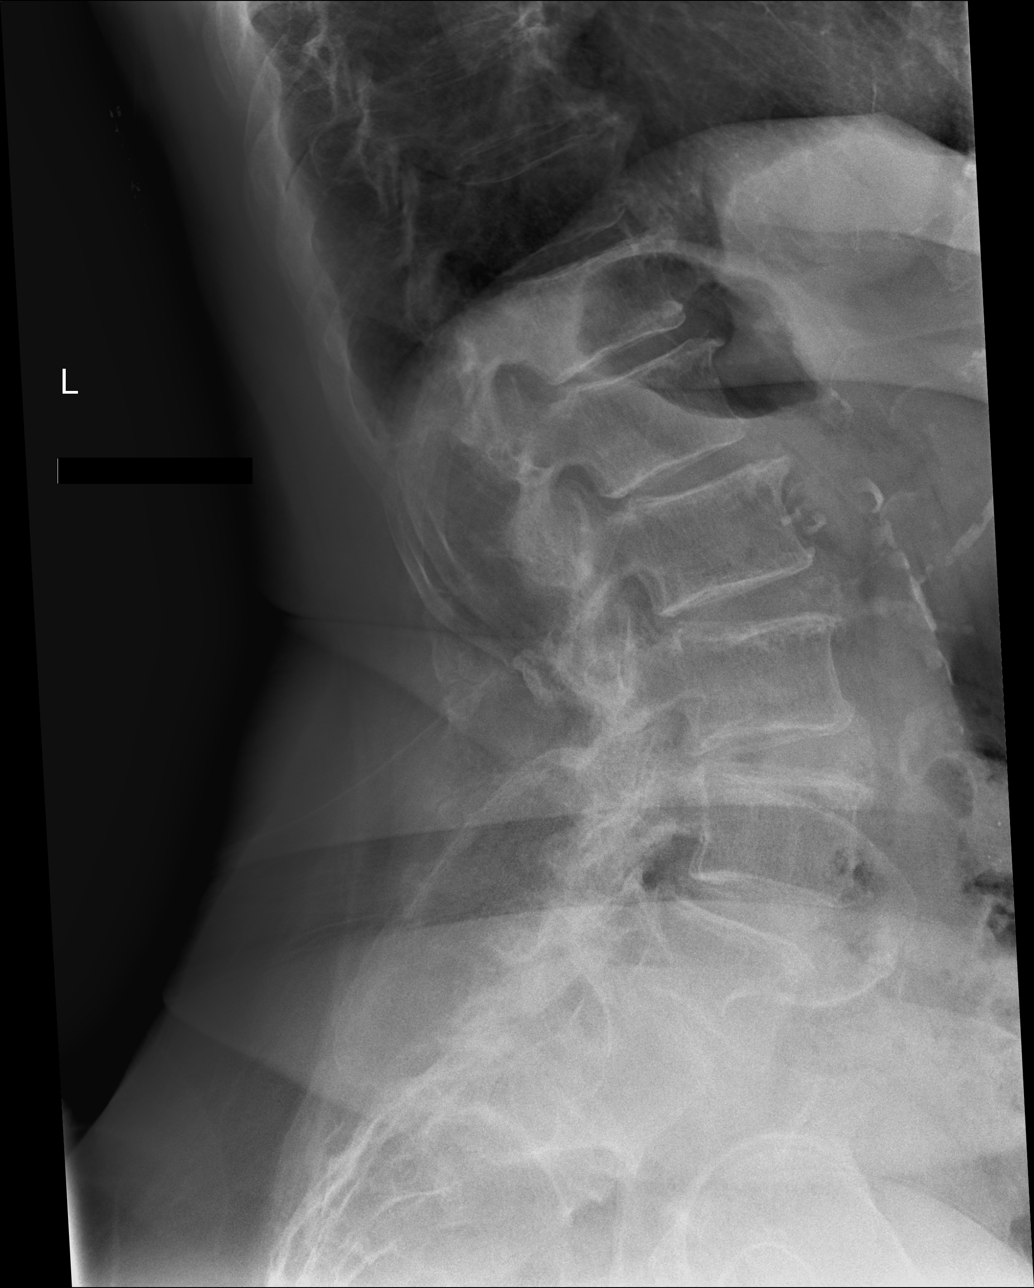

[4 of 4 positions shown; findings below may reference images not displayed]

DIAGNOSTIC STUDIES

EXAM

XR lumbar spine 5 view

INDICATION

Lumbar Spondylosis
Pt c/o chronic low back pain. JT/TJ

TECHNIQUE

AP lateral spot and flexion extension views were obtained.

COMPARISONS

CT lumbar spine September 22, 2021

FINDINGS

There is disc space narrowing and osteophytic spurring throughout the lumbar spine and facet
hypertrophy throughout the lumbar spine. There is slight anterior listhesis of L4 in relation to L5
measuring 8 millimeters. This does not change significantly with flexion extension. No compression
fractures are seen.

IMPRESSION

Diffuse spondylitic changes throughout the lumbar spine associated facet hypertrophy.

Slight anterior listhesis of L4 in relation to L5 which does not change significantly with flexion
and extension.

Tech Notes:

Pt c/o chronic low back pain. JT/TJ

## 2022-02-01 ENCOUNTER — Encounter: Admit: 2022-02-01 | Discharge: 2022-02-01 | Payer: MEDICARE

## 2022-05-17 ENCOUNTER — Encounter: Admit: 2022-05-17 | Discharge: 2022-05-17 | Payer: MEDICARE

## 2022-05-20 ENCOUNTER — Encounter: Admit: 2022-05-20 | Discharge: 2022-05-20 | Payer: MEDICARE

## 2022-05-24 ENCOUNTER — Encounter: Admit: 2022-05-24 | Discharge: 2022-05-24 | Payer: MEDICARE

## 2022-05-27 ENCOUNTER — Ambulatory Visit: Admit: 2022-05-27 | Discharge: 2022-05-27 | Payer: MEDICARE

## 2022-05-27 ENCOUNTER — Encounter: Admit: 2022-05-27 | Discharge: 2022-05-27 | Payer: MEDICARE

## 2022-05-27 DIAGNOSIS — R0989 Other specified symptoms and signs involving the circulatory and respiratory systems: Secondary | ICD-10-CM

## 2022-05-27 DIAGNOSIS — I351 Nonrheumatic aortic (valve) insufficiency: Secondary | ICD-10-CM

## 2022-05-27 DIAGNOSIS — R011 Cardiac murmur, unspecified: Secondary | ICD-10-CM

## 2022-05-27 DIAGNOSIS — E7849 Other hyperlipidemia: Secondary | ICD-10-CM

## 2022-05-27 DIAGNOSIS — I2510 Atherosclerotic heart disease of native coronary artery without angina pectoris: Secondary | ICD-10-CM

## 2022-05-27 DIAGNOSIS — I10 Essential (primary) hypertension: Secondary | ICD-10-CM

## 2022-05-27 DIAGNOSIS — R0600 Dyspnea, unspecified: Secondary | ICD-10-CM

## 2022-05-27 DIAGNOSIS — R55 Syncope and collapse: Secondary | ICD-10-CM

## 2022-05-27 DIAGNOSIS — R5383 Other fatigue: Secondary | ICD-10-CM

## 2022-05-27 DIAGNOSIS — E785 Hyperlipidemia, unspecified: Secondary | ICD-10-CM

## 2022-05-27 NOTE — Progress Notes
Date of Service: 05/27/2022    Brianna Christensen is a 81 y.o. female.       HPI      Brianna Christensen is a 81 y.o. 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    Today, 05/27/2022 patient did undergo evaluation with a 2D echo Doppler study, the final report is pending at the time of this dictation.    Overall, from a cardiac standpoint she has remained stable without any symptoms of chest pain and no heart palpitations.    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    She continues to have pain of the lower back and hips, she does use a cane for ambulation.       Vitals:    05/27/22 1542   BP: (!) 140/82   BP Source: Arm, Left Upper   Pulse: 85   SpO2: 97%   O2 Device: None (Room air)   PainSc: Five   Weight: 84.2 kg (185 lb 9.6 oz)   Height: 168.9 cm (5' 6.5)     Body mass index is 29.51 kg/m?Marland Kitchen     Past Medical History  Patient Active Problem List    Diagnosis Date Noted   ? Bilateral carotid bruits 02/05/2021   ? Hospital discharge follow-up 08/07/2020   ? 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 Coronary artery calcification 05/18/2017     05/03/17 - CCTA  at [facility] - Mild to moderate areas of mixed plaquing noted. Though FFR assessment suggested no significant obstructive coronary artery disease.  03/18/17 - MPI Stress Test at [facility] - 1. This study is probably normal with mild intensity attenuation in the anterior wall.  All segments are viable global left ventricular function is within normal limits other high risk indicators are not noted.  The patient has poor exercise capacity which placed the patient intermediate risk category but in light of the attenuation artifact anteriorly no definite regional perfusion defects are noted.  If the clinical suspicion of coronary disease is intermediate consider coronary CTA with FFR.       ? Essential hypertension 03/07/2017     Echo - 01/09/18 at [facility] - 1. Normal LV size with concentric remodeling and normal systolic function, EF ~ 60%;  No regional wall motion abnormalities. 2. Diastolic function is technically indeterminate.Normal size atria bilaterally 3. Aortic sclerosis without stenosis.  Mild to moderate regurgitation; 4. Visualized portion of the aortic root is within normal limits according to patient's body surface area. 5. Estimated peak systolic PA pressure =  24 mmHg; 6. No pericardial effusion  ?     ? Fatigue 03/04/2017   ? Hyperlipidemia 03/04/2017     03/18/17 - MPI Stress Test at [facility] - 1. This study is probably normal with mild intensity attenuation in the anterior wall.  All segments are viable global left ventricular function is within normal limits other high risk indicators are not noted.  The patient has poor exercise capacity which placed the patient intermediate risk category but in light of the attenuation artifact anteriorly no definite regional perfusion  defects are noted.  If the clinical suspicion of coronary disease is intermediate consider coronary CTA with FFR.     ? Near syncope 03/04/2017   ? Murmur 03/03/2017     02/25/2017 Echo at [facility]  Mitral valve leaflets appear mildly thickened.  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  The mitral regurgitation jet was central. 2.  Trileaflet aortic valve.  The aortic cusps appear mildly thickened.  Aortic cusps appear mildly calcified.  The peak transaortic gradient was 17.00 mmHg.  The mean transaortic gradient was 7.00 mmHg.  Moderate aortic valve regurgitation.  Normal LV systolic function.  LV EF 62%.  Normal left ventricular wall thickness.            Review of Systems   Constitutional: Positive for malaise/fatigue.   HENT: Negative.    Eyes: Negative.    Cardiovascular: Positive for dyspnea on exertion.   Respiratory: Negative.    Endocrine: Negative.    Hematologic/Lymphatic: Negative.    Skin: Negative.    Musculoskeletal: Positive for back pain.   Gastrointestinal: Negative.    Genitourinary: Negative.    Neurological: Negative.    Psychiatric/Behavioral: Negative.    Allergic/Immunologic: Negative.        Physical Exam  General Appearance: normal in appearance, uses a cane for ambulation  Skin: warm, moist, no ulcers or xanthomas  Eyes: conjunctivae and lids normal, pupils are equal and round  Lips & Oral Mucosa: no pallor or cyanosis  Neck Veins: neck veins are flat, neck veins are not distended  Chest Inspection: chest is normal in appearance  Respiratory Effort: breathing comfortably, no respiratory distress  Auscultation/Percussion: lungs clear to auscultation, no rales or rhonchi, no wheezing  Cardiac Rhythm: regular rhythm and normal rate  Cardiac Auscultation: S1, S2 normal, no rub, no gallop  Murmurs: no murmur  Carotid Arteries: normal carotid upstroke bilaterally, bilateral carotid bruits  Lower Extremity Edema: no lower extremity edema  Abdominal Exam: soft, non-tender, no masses, bowel sounds normal  Liver & Spleen: no organomegaly  Language and Memory: patient responsive and seems to comprehend information  Neurologic Exam: neurological assessment grossly intact        Cardiovascular Studies      Cardiovascular Health Factors  Vitals BP Readings from Last 3 Encounters:   05/27/22 (!) 140/82   08/13/21 112/60   02/05/21 132/68     Wt Readings from Last 3 Encounters:   05/27/22 84.2 kg (185 lb 9.6 oz)   08/13/21 82.3 kg (181 lb 6.4 oz)   02/05/21 80.3 kg (177 lb)     BMI Readings from Last 3 Encounters:   05/27/22 29.51 kg/m?   08/13/21 28.84 kg/m?   02/05/21 28.14 kg/m? Smoking Social History     Tobacco Use   Smoking Status Never   Smokeless Tobacco Never      Lipid Profile Cholesterol   Date Value Ref Range Status   02/06/2021 162  Final     HDL   Date Value Ref Range Status   02/06/2021 62  Final     LDL   Date Value Ref Range Status   02/06/2021 77  Final     Triglycerides   Date Value Ref Range Status   02/06/2021 115  Final      Blood Sugar No results found for: HGBA1C  Glucose   Date Value Ref Range Status   05/07/2022 98  Final   06/24/2021 103  Final   01/20/2021 92  Final  Problems Addressed Today  Encounter Diagnoses   Name Primary?   ? Other hyperlipidemia Yes   ? Essential hypertension    ? Coronary artery calcification    ? Nonrheumatic aortic valve insufficiency    ? Bilateral carotid bruits        Assessment and Plan      Assessment:    1.  Primary hypertension-patient is currently on an ACE inhibitor and a beta-blocker, her blood pressure is a reasonable control  2.  Bilateral carotid bruit-patient was evaluated with a carotid duplex in July 2022, she was not found to have any obstructive coronary artery disease  3.  Coronary artery calcification  ? She did undergo FFR study by a coronary CTA and it was not significant  ? Patient was also evaluated with a perfusion imaging study in August 2018, the tomographic pattern was negative for ischemia  4.  Mild to moderate nonrheumatic aortic valve insufficiency  Patient did undergo a 2D echo Doppler study evaluation today, final report pending at the time of this dictation  5.  Osteoarthritis of bilateral hips and lower back  6.  Patient does use a cane for ambulation    Plan:    1.  Continue all current cardiac medications  2.  We will follow-up on the results of the echocardiogram and call when report available for review, we will make further recommendations  3.  Further evaluation at the spine center at [facility] with Dr. Jolayne Haines.  4.  Follow-up office visit with me in approximately 8 to 10 months    Total Time Today was 30 minutes in the following activities: Preparing to see the patient, Obtaining and/or reviewing separately obtained history, Performing a medically appropriate examination and/or evaluation, Counseling and educating the patient/family/caregiver, Ordering medications, tests, or procedures, Referring and communication with other health care professionals (when not separately reported), Documenting clinical information in the electronic or other health record, Independently interpreting results (not separately reported) and communicating results to the patient/family/caregiver and Care coordination (not separately reported)         Current Medications (including today's revisions)  ? acetaminophen (TYLENOL) 325 mg tablet Take two tablets by mouth twice daily.   ? Calcium Carbonate 600 mg calcium (1,500 mg) tab Take one tablet by mouth twice daily.   ? CHOLEcalciferoL (vitamin D3) (VITAMIN D3) 1,000 units tablet Take one tablet by mouth daily.   ? lisinopriL (ZESTRIL) 20 mg tablet Take one tablet by mouth twice daily.   ? metoprolol tartrate (LOPRESSOR) 50 mg tablet Take one tablet by mouth twice daily.   ? MULTIVITAMIN PO Take 1 tablet by mouth daily.   ? rosuvastatin (CRESTOR) 20 mg tablet Take one tablet by mouth daily.   ? Vit A,C & E-Lutein-Minerals 1,000 unit-200 mg-60 unit-2 mg tab Take 1 tablet by mouth daily.

## 2022-07-26 ENCOUNTER — Encounter: Admit: 2022-07-26 | Discharge: 2022-07-26 | Payer: MEDICARE

## 2022-07-28 ENCOUNTER — Encounter: Admit: 2022-07-28 | Discharge: 2022-07-28 | Payer: MEDICARE

## 2022-07-28 DIAGNOSIS — M48062 Spinal stenosis, lumbar region with neurogenic claudication: Secondary | ICD-10-CM

## 2022-07-29 ENCOUNTER — Encounter: Admit: 2022-07-29 | Discharge: 2022-07-29 | Payer: MEDICARE

## 2022-07-29 ENCOUNTER — Ambulatory Visit: Admit: 2022-07-29 | Discharge: 2022-07-29 | Payer: MEDICARE

## 2022-07-29 DIAGNOSIS — R5383 Other fatigue: Secondary | ICD-10-CM

## 2022-07-29 DIAGNOSIS — R011 Cardiac murmur, unspecified: Secondary | ICD-10-CM

## 2022-07-29 DIAGNOSIS — M2550 Pain in unspecified joint: Secondary | ICD-10-CM

## 2022-07-29 DIAGNOSIS — M48062 Spinal stenosis, lumbar region with neurogenic claudication: Secondary | ICD-10-CM

## 2022-07-29 DIAGNOSIS — H269 Unspecified cataract: Secondary | ICD-10-CM

## 2022-07-29 DIAGNOSIS — D539 Nutritional anemia, unspecified: Secondary | ICD-10-CM

## 2022-07-29 DIAGNOSIS — I10 Essential (primary) hypertension: Secondary | ICD-10-CM

## 2022-07-29 DIAGNOSIS — R0600 Dyspnea, unspecified: Secondary | ICD-10-CM

## 2022-07-29 DIAGNOSIS — E785 Hyperlipidemia, unspecified: Secondary | ICD-10-CM

## 2022-07-29 DIAGNOSIS — R0989 Other specified symptoms and signs involving the circulatory and respiratory systems: Secondary | ICD-10-CM

## 2022-07-29 DIAGNOSIS — R55 Syncope and collapse: Secondary | ICD-10-CM

## 2022-07-29 NOTE — Progress Notes
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    Social History     Socioeconomic History    Marital status: Married   Tobacco Use    Smoking status: Never    Smokeless tobacco: Never   Substance and Sexual Activity    Alcohol use: Yes     Alcohol/week: 4.0 standard drinks of alcohol     Types: 4 Glasses of wine per week    Drug use: No    Sexual activity: Not Currently     Partners: Male     Birth control/protection: Post-menopausal       No Known Allergies      Current Outpatient Medications:     acetaminophen (TYLENOL) 325 mg tablet, Take two tablets by mouth twice daily., Disp: , Rfl:     Calcium Carbonate 600 mg calcium (1,500 mg) tab, Take one tablet by mouth twice daily., Disp: , Rfl:     CHOLEcalciferoL (vitamin D3) (VITAMIN D3) 1,000 units tablet, Take one tablet by mouth daily., Disp: , Rfl:     lisinopriL (ZESTRIL) 20 mg tablet, Take one tablet by mouth twice daily., Disp: , Rfl:     metoprolol tartrate (LOPRESSOR) 50 mg tablet, Take one tablet by mouth twice daily., Disp: , Rfl:     MULTIVITAMIN PO, Take 1 tablet by mouth daily., Disp: , Rfl:     rosuvastatin (CRESTOR) 20 mg tablet, Take one tablet by mouth daily., Disp: 90 tablet, Rfl: 3    Vit A,C & E-Lutein-Minerals 1,000 unit-200 mg-60 unit-2 mg tab, Take 1 tablet by mouth daily., Disp: , Rfl:     Vitals:    07/29/22 1359   BP: (!) 209/69   Pulse: 100   PainSc: Six   Weight: 83.9 kg (185 lb)   Height: 167.6 cm (5' 6)       Oswestry Total Score:: 42    Pain Score: Six    Body mass index is 29.86 kg/m?Marland Kitchen    Review of Systems

## 2022-07-30 ENCOUNTER — Encounter: Admit: 2022-07-30 | Discharge: 2022-07-30 | Payer: MEDICARE

## 2022-08-05 ENCOUNTER — Ambulatory Visit: Admit: 2022-08-05 | Discharge: 2022-08-06 | Payer: MEDICARE

## 2022-08-05 ENCOUNTER — Encounter: Admit: 2022-08-05 | Discharge: 2022-08-05 | Payer: MEDICARE

## 2022-08-05 DIAGNOSIS — R0989 Other specified symptoms and signs involving the circulatory and respiratory systems: Secondary | ICD-10-CM

## 2022-08-05 DIAGNOSIS — R55 Syncope and collapse: Secondary | ICD-10-CM

## 2022-08-05 DIAGNOSIS — R5383 Other fatigue: Secondary | ICD-10-CM

## 2022-08-05 DIAGNOSIS — I10 Essential (primary) hypertension: Secondary | ICD-10-CM

## 2022-08-05 DIAGNOSIS — D539 Nutritional anemia, unspecified: Secondary | ICD-10-CM

## 2022-08-05 DIAGNOSIS — H269 Unspecified cataract: Secondary | ICD-10-CM

## 2022-08-05 DIAGNOSIS — E785 Hyperlipidemia, unspecified: Secondary | ICD-10-CM

## 2022-08-05 DIAGNOSIS — R0600 Dyspnea, unspecified: Secondary | ICD-10-CM

## 2022-08-05 DIAGNOSIS — M2550 Pain in unspecified joint: Secondary | ICD-10-CM

## 2022-08-05 DIAGNOSIS — R011 Cardiac murmur, unspecified: Secondary | ICD-10-CM

## 2022-08-05 NOTE — Patient Instructions
Spinal Cord Stimulator General Instructions for Trial & Implant      Scheduling  The Spinal Cord Stimulator (SCS) coordinators will contact you for your appointments.   You will need to have a psychological exam prior to the procedure. (They will assist you with scheduling this.)   You will need to fill out a pre-surgical questionnaire. (This will be mailed to you.)   A nasal swab will be collected to assess for any ?germ? that can live on your skin without knowing or showing any symptoms. Specifically, we are looking for Meticillin Resistant Staphylococcus Aureus (MRSA). We are committed to reducing post-operative infections and giving our patients quality care.   No news is good news after this swab. The clinical nurse coordinator will only contact you if this result is positive.     Appointment One (SCS Trial in Procedures)    You will first be scheduled for a trial to see how the SCS will work for you.   The temporary implant will be placed in procedures, externally. You will wear a fannypack-like device around your waist for up to 7 days.   It is REQUIRED you have a driver after procedure due to anesthesia sedation.  Do not eat or drink after midnight before the procedure.  You will not be able to shower or submerge in any water during your trial phase. Sponge baths only.  If you are on any blood thinners, or take medications for chronic issues, please ask the clinical nurse coordinator for instructions on these specific medications.   Please contact Clinical Nurse Coordinator for questions/concerns.  Avoid bending, twisting, lifting arms over the head or heavy lifting during your trial phase.  You may, or may not, be instructed to withhold from driving during your trial phase. Please clarify this with your provider, since this is based on the type of device you have implanted.   Your Spinal Cord Stimulator Representative will be in contact with you daily during your trial to guide you through adjusting settings if necessary.   It is REQUIRED you shower thoroughly with chlorhexidine gluconate (CHG), or more commonly known as Hibiclens? the day before and the morning of the procedure. (Hibiclens? is available at local drug stores. It typically costs $6-10). If you are allergic to this cleanse please notify the clinical nurse coordinator.  Patient instructions for CHG soap use:  Purchase the soap described above.  Wash your hair as normal with your normal shampoo and rinse.  Wash your face and genital area with regular soap and rinse.  DO NOT use CHG soap on your face, near your eyes, or on your genital area.  Apply the CHG soap to the rest of your body and your entire back. Scrub gently for 5 minutes trying to avoid rinsing before this time. You may need to turn the water off during this time to avoid rinsing.  Rinse and do not apply any other soaps, oils/lotions/creams, powder, deodorants, or perfumes on the areas you cleaned with CHG soap.   Repeat one last time on the morning of your surgery.  If you have any other questions about this do not hesitate to call.      Appointment Two - SCS Trial (Clinic Follow-up)    A week after the temporary implant (SCS trial), you will have an appointment, in clinic, to remove the temporary implant and to assess how well it worked for you.  If the trial is successful, you will be scheduled for the permanent implant  in the operating room on a separate day.   Clinical Nurse Coordinator will review pre-operative guidelines.     Appointment Three - Permanent Implant (The Permanent Implant will be placed in the Operating Room)    It is REQUIRED you shower thoroughly with chlorhexidine gluconate (CHG), or more commonly known as Hibiclens? the day before and the morning of the procedure. (Hibiclens? is available at local drug stores. It typically costs $6-10.) If you are allergic to this cleanse please notify the clinical nurse coordinator.   Patient instructions for CHG soap use:  Purchase the soap described above.  Wash your hair as normal with your normal shampoo and rinse.  Wash your face and genital area with regular soap and rinse.  DO NOT use CHG soap on your face, near your eyes, or on your genital area.  Apply the CHG soap to the rest of your body and your entire back. Scrub gently for 5 minutes trying to avoid rinsing before this time. You may need to turn the water off during this time to avoid rinsing.  Rinse and do not apply any other soaps, oils/lotions/creams, powder, deodorants, or perfumes on the areas you cleaned with CHG soap.   Repeat one last time on the morning of your surgery.  If you have any other questions about this do not hesitate to call.  Do not eat or drink anything after midnight prior to your scheduled procedure.   If you are on any blood thinners, or take medications for chronic issues, please ask the clinical nurse coordinator for instructions on these specific medications.  You will receive anesthesia sedation so it is REQUIRED you have a driver.   Please contact Clinical Nurse Coordinator for questions/concerns.    Instructions the Week the Permanent Stimulator is Implanted    There will be two incisions covered with gauze and tape after implant. If you notice or start to experience any of the following:   Swelling  Persistent redness  Black and blue bruising  New bleeding or drainage from the insertion sites  Severe headache  Fever > 101F  New onset of sharp, severe neck or back pain  Please notify the clinical nurse coordinator immediately. (If this is after hours please call [phone] and have the Anesthesia Pain on call provider paged.)   Do not shower. You will have to sponge bathe until directed by your provider.  Avoid bending, twisting, lifting arms over head or heavy lifting for 6 weeks after insertion. This will keep the lead in the correct spot while your body heals it into place.  The Spinal Cord Stimulator will not be turned on for the first week after implant. At your follow up visit when the staples have been removed, the device rep is present, and orders have been given from provider the device will be turned on.      Appointment Four (Clinic Follow-up)    You will need to be scheduled 10 days after the procedure for removal of the staples.  It is okay for you to drive yourself that day.  The device representative will attend that appointment to initiate and educate in regards to the programming. Bring all of the stimulator pieces you were given with you to this appointment. (Anticipate this appointment may take as much as an hour.)    Once the staples are removed they will be replaced with steri-strips. (It is advised you wear loose fitting clothing to prevent irritation to the surgical sites.)  You may  shower 3 days after the steri-strips have been placed. Allow the steri-strips to fall off on their own.  Do not apply any soap, lotions/creams or oils directly to the incision site unless directed to do so by your doctor or nurse practitioner.  If you notice or start to experience any of the following:   Swelling  Persistent redness  Black and blue bruising  New bleeding or drainage from the insertion sites  Develop a severe headache  Fever < 101F  New onset of sharp, severe neck or back pain  Please notify the clinical nurse coordinator immediately. (If this is after hours please call [phone] and have the Anesthesia Pain on call provider paged.)   You will follow up in 2-4 weeks to check on incision and pain control.         If you have any questions in regards to insurance or scheduling feel free to call the following Spinal Cord Stimulator (SCS) coordinators.    Brianna Christensen (if last name begins with A-J) at [phone]  or    Brianna Christensen (if last name begins with K-Z) at [phone].

## 2022-08-09 ENCOUNTER — Encounter: Admit: 2022-08-09 | Discharge: 2022-08-09 | Payer: MEDICARE

## 2022-08-23 ENCOUNTER — Encounter: Admit: 2022-08-23 | Discharge: 2022-08-23 | Payer: MEDICARE

## 2022-08-24 ENCOUNTER — Encounter: Admit: 2022-08-24 | Discharge: 2022-08-24 | Payer: MEDICARE

## 2022-09-01 ENCOUNTER — Encounter: Admit: 2022-09-01 | Discharge: 2022-09-01 | Payer: MEDICARE

## 2022-09-10 ENCOUNTER — Encounter: Admit: 2022-09-10 | Discharge: 2022-09-10 | Payer: MEDICARE

## 2022-09-13 ENCOUNTER — Encounter: Admit: 2022-09-13 | Discharge: 2022-09-13 | Payer: MEDICARE

## 2022-09-13 NOTE — Telephone Encounter
Progress Note  Social Work Tourist information centre manager      Plan:     Shooting pain in leg        Intervention:     This SW received a msg and returned call to pt who reported having shooting pain in her leg now.  Pt stated she couldn't remember who it was from the [place] that she previously spoke w/and was asked about having pain in leg.  Pt stated at that time she reported none, but she does have pain now and wanted to make sure she was reporting it.    This SW stated our previous conversation was about the SCS Trial.  This SW offered to send this information to Dr. Laurelyn Sickle Nurse, Dalia Heading for further f/u.  Pt was appreciative and will wait to hear back from [place].          Trenton Founds, LMSW, [place]  [phone]

## 2022-09-14 ENCOUNTER — Ambulatory Visit: Admit: 2022-09-14 | Discharge: 2022-09-14 | Payer: MEDICARE

## 2022-09-14 ENCOUNTER — Encounter: Admit: 2022-09-14 | Discharge: 2022-09-14 | Payer: MEDICARE

## 2022-09-14 DIAGNOSIS — M47816 Spondylosis without myelopathy or radiculopathy, lumbar region: Secondary | ICD-10-CM

## 2022-09-14 NOTE — Progress Notes
PATIENT MRN: [id number]  PATIENT NAME: Brianna Christensen  DATE OF SERVICE:  09/14/22  START VISIT: 1302  STOP VISIT:  1419  REFERRAL SOURCE: Dr. Thayer Dallas Hussaini    DOB:[birth year]  AGE:82 y.o.  SEX: female  RACE: White  EDUCATION:  High School    PREOPERATIVE PSYCHOLOGICAL EVALUATION    REASON FOR REFERRAL: Brianna Christensen is a 81 y.o. female who was referred by Dr. Yetta Barre for a psychological evaluation to assess mental strength and wellbeing related to candidacy for spinal cord stimulator surgery. She attended evaluation with husband, Brianna Christensen, today with Pt's permission.    PAIN HISTORY: Brianna Christensen began having pain in back and radiating down the legs, especially the left having more pain. She has tried multiple forms of intervention including: physical therapy, water therapy, injections, pain medications, ice, heating pads.  Patient noted none of these interventions were best. Patient's average pain is 5, with a range of 0 to 10. She reported it impacts her daily functioning in various ways, with the biggest impact related to her ability to move and clean, limits getting out of the house, and limits walking.     MENTAL HEALTH HISTORY: Brianna Christensen denied mental health diagnoses in the past. She denied taking medications for mental health reasons. She denied seeing a counselor or therapist in her lifetime. Brianna Christensen has never been hospitalized for psychiatric issues.  She denied a history of suicidal or homicidal ideation, plans, or attempts. She denied a history of self-harm behaviors.      CURRENT MENTAL HEALTH: Brianna Christensen endorsed the following symptoms:    Depression No    Anxiety No    Panic attacks No    PTSD No    OCD No    Mania No    Psychosis No    Hallucinations No    Delusions No    Self-harm behaviors No    Suicidal ideation No    Homicidal ideation No        Current psychiatric medications: Denied taking current psychiatric medications.    Current psychiatric provider: N/a    SUBSTANCE USE:   Tobacco: The patient denied lifetime use of tobacco products.               Length of use: n/a   Amount of use: n/a              Last use: n/a               Form of use: n/a  Drugs: The patient denied lifetime illicit drug use.                Length of use: n/a              Last use: n/a              Jail/Prison: n/a              Detox/Rehab: n/a  Alcohol: The patient reported current alcohol use.               Length of use: Since 82 years of age, has been social drinking throughout her life.               Number of drinks: one short glass of wine typically at one sitting. This happens about 4-5 times per week with a meal.               Last drink: last night  DUIs: denied              Jail/Prison: denied              Detox/Rehab: denied              Work/Relationship issues due to alcohol: denied               Addiction counseling/AA: denied    Access to Firearms: Brianna Christensen reported having firearms in her home that are locked away with ammo separate. She was educated about safe storage of firearms and agreed to this.     CURRENT HEALTH BEHAVIORS: Brianna Christensen reported a normal appetite and diet. She does minimal stretching for exercise. She reported getting 8 hours of sleep per night on average.  She takes vitamins/supplements.  She drinks one cappuccino about once per week.      PAST MEDICAL HISTORY:  Medical History:   Diagnosis Date    Bilateral carotid bruits 02/05/2021    Cataract 08/26/2014    Dyspnea     Essential hypertension 07/26/2016    Fatigue 03/04/2017    Heart murmur 07/27/2015    Hyperlipemia     Hyperlipidemia 03/04/2017    Joint pain 07/27/1987    Murmur 03/03/2017    Near syncope 03/04/2017    Unspecified deficiency anemia 07/27/2011       ALLERGIES:   No Known Allergies    PAST SURGICAL HISTORY:    Surgical History:   Procedure Laterality Date    HX APPENDECTOMY  07/26/1950    HX HYSTERECTOMY  05/26/1973    KNEE SURGERY  03/14/2018       SOCIAL BACKGROUND/HISTORY: Brianna Christensen was born in [place], [place] and currently lives in [place] and raised by her biological parents. She is the 2nd of 3 children. She has two brothers. She denied experiencing any abuse or neglect in her lifetime.  She described family relationships as good.  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 She is not on disability. Parents are deceased. She is currently married to her husband for 60 years. The patient has two children, son and daughter. Son, Brianna Christensen, lives in [place] and daughter, Brianna Christensen, lives in [place]. She denied other significant life stressors at this time. She reported having good social support with friends and family. She enjoys the following activities/hobbies: read, crochet, going to casino, attends church weekly, going trap shooting.      UNDERSTANDING OF SURGERY: Brianna Christensen demonstrated an adequate awareness of what the spinal cord stimulator procedure would entail. She demonstrated awareness of the risks of infection and need for follow-up care following the surgery. She also demonstrated understanding of the lifestyle changes associated with more favorable outcomes of surgery.  Additionally, the patient reported willingness to seek surgery under these conditions and evidenced intent to comply with related behavioral recommendations.  Her expectations for pain reeducation are appropriate.  Her motives for surgery are appropriate; she desires to improve her health and quality of life.      Caregiver Information: Brianna Christensen, spouse, will serve as patient?s caregiver following surgery. Brianna Christensen, sister-in law, is the backup caregiver. Brianna Christensen, their son, can be another back-up.     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 Of note, team should be aware of difficulties with memory and executive functioning. Recommend having paper copies of information/instructions and teach-back methods for Pt and caregivers.     Stanford Integrated Psychosocial Assessment for Transplantation (SIPAT) SCORES:              (Note: Lower scores indicate stronger candidate status).  Patient?s Readiness Level: 0  Social Support System: 0  Psychological Stability & Psychopathology: 2  Lifestyle and effect of substance use: 3  SIPAT TOTATL SCORE: 5  SIPAT SCORE INTERPRETATION: Excellent Candidate    Mental Status Examination/Behavioral Observations:  General/Constitutional: Related appropriately.   Speech/Motor: Fluent. Normal for rate, rhythm, and tone   Mood/Affect: Good/Congruent affect.  Thought Process: Linear, goal directed, coherent, easy to understand  Associations: Intact  Thought Content:  Neg for delusions, phobias, and preoccupations.   Perception: Neg for AH, VH.   Insight/Judgement: fair/fair  Orientation: Alert/ oriented x 3  Recent and Remote Memory: Grossly intact  Attention span and concentration: Intact  Language: Unremarkable  Fund of knowledge and vocabulary: Good  Suicidal Ideation: Denied      IMPRESSIONS: Brianna Christensen is a 82 y.o. female presenting with chronic pain and current comorbid health problems.  She denied lifetime substance abuse.  She also denied current psychiatric symptoms. She reported having a good support system.  Patient demonstrated a realistic outlook SCS procedure and expected pain reduction.  Furthermore, she has a good understanding of the risks and benefits of the procedure.  Given this information, the patient appears to be a suitable candidate for surgery.     Diagnostic Impression:      Deferred  Pre-surgical evaluation candidate    Medical Diagnosis:   Osteoporosis  Lumbar spondylosis  Spondylolisthesis of lumbar region    Plan/Recommendations:     1. Encourage patient to review educational materials provided by [facility] and the following website: [email or web address]    2. Encourage patient to share educational materials with caregivers.    3. Dr. Gustavus Messing at [facility] provides psychology services for psychotherapy related to chronic pain management and post-operative care as needed.      The proposed treatment plan was discussed with the patient/guardian who was provided the opportunity to ask questions and make suggestions regarding alternative treatment.      Thank you for the opportunity to be involved with this patient?s care.      Brianna Christensen, M.S.  Psychology Intern  Department of Psychiatry and Behavioral Sciences    Supervisor:  Wyline Beady, Ph.D.  Faculty  Department of Psychiatry and [employer] [facility]

## 2022-09-15 ENCOUNTER — Encounter: Admit: 2022-09-15 | Discharge: 2022-09-15 | Payer: MEDICARE

## 2022-09-15 DIAGNOSIS — D539 Nutritional anemia, unspecified: Secondary | ICD-10-CM

## 2022-09-15 DIAGNOSIS — R55 Syncope and collapse: Secondary | ICD-10-CM

## 2022-09-15 DIAGNOSIS — I351 Nonrheumatic aortic (valve) insufficiency: Secondary | ICD-10-CM

## 2022-09-15 DIAGNOSIS — I10 Essential (primary) hypertension: Secondary | ICD-10-CM

## 2022-09-15 DIAGNOSIS — I2510 Atherosclerotic heart disease of native coronary artery without angina pectoris: Secondary | ICD-10-CM

## 2022-09-15 DIAGNOSIS — R0989 Other specified symptoms and signs involving the circulatory and respiratory systems: Secondary | ICD-10-CM

## 2022-09-15 DIAGNOSIS — R5383 Other fatigue: Secondary | ICD-10-CM

## 2022-09-15 DIAGNOSIS — M2550 Pain in unspecified joint: Secondary | ICD-10-CM

## 2022-09-15 DIAGNOSIS — Z136 Encounter for screening for cardiovascular disorders: Secondary | ICD-10-CM

## 2022-09-15 DIAGNOSIS — E785 Hyperlipidemia, unspecified: Secondary | ICD-10-CM

## 2022-09-15 DIAGNOSIS — R011 Cardiac murmur, unspecified: Secondary | ICD-10-CM

## 2022-09-15 DIAGNOSIS — E7849 Other hyperlipidemia: Secondary | ICD-10-CM

## 2022-09-15 DIAGNOSIS — R0600 Dyspnea, unspecified: Secondary | ICD-10-CM

## 2022-09-15 DIAGNOSIS — H269 Unspecified cataract: Secondary | ICD-10-CM

## 2022-09-15 MED ORDER — CARVEDILOL 25 MG PO TAB
25 mg | ORAL_TABLET | Freq: Two times a day (BID) | ORAL | 3 refills | 90.00000 days | Status: AC
Start: 2022-09-15 — End: ?

## 2022-09-15 NOTE — Patient Instructions
Thank you for visiting our office today.    We would like to make the following medication adjustments:      Take 3 tablets of carvedilol daily  Stop taking metoprolol         Otherwise continue the same medications as you have been doing.          We will be pursuing the following tests after your appointment today:       Carotid duplex       Please call us in the meantime with any questions or concerns.        Please allow 5-7 business days for our providers to review your results. All normal results will go to MyChart. If you do not have Mychart, it is strongly recommended to get this so you can easily view all your results. If you do not have mychart, we will attempt to call you once with normal lab and testing results. If we cannot reach you by phone with normal results, we will send you a letter.  If you have not heard the results of your testing after one week please give [place] a call.       Your Cardiovascular Medicine [facility] Team Richardson Landry, Rene Kocher and [place])  phone number is [phone].

## 2022-09-15 NOTE — Progress Notes
Date of Service: 09/15/2022    Brianna Christensen is a 82 y.o. female.       HPI      Brianna Christensen is a 82 y.o. 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    Evaluation with a 2D echo Doppler study in November 2023 demonstrated Normal LVEF = 60%, sclerotic aortic valve with mildly elevated MG = 8 mmHg, no significant stenosis, no other significant valvular abnormalities, estimated PAP = 26 mmHg.    Due to lower spine osteoarthritis patient did undergo evaluation in the spine clinic, surgical intervention is not a feasible option at present time.    Patient was evaluated by Dr. Welton Flakes in the spine clinic at [facility], it was recommended to undergo a Nevro high-frequency therapy.    From a cardiac standpoint this patient remained stable.  She has not experienced any symptoms of chest pain and no heart palpitations.  Due to osteoarthritis ambulation is somewhat difficult and she does use a cane as an ambulatory aid.         Vitals:    09/15/22 1047   BP: (!) 152/92   BP Source: Arm, Right Upper   Pulse: 70   SpO2: 97%   O2 Device: None (Room air)   PainSc: Zero   Weight: 84 kg (185 lb 3.2 oz)   Height: 167.6 cm (5' 6)     Body mass index is 29.89 kg/m?Marland Kitchen     Past Medical History  Patient Active Problem List    Diagnosis Date Noted    Bilateral carotid bruits 02/05/2021    Hospital discharge follow-up 08/07/2020    Nonrheumatic aortic valve insufficiency 01/09/2018    Coronary artery calcification 05/18/2017     05/03/17 - CCTA  at [facility] - Mild to moderate areas of mixed plaquing noted. Though FFR assessment suggested no significant obstructive coronary artery disease.  03/18/17 - MPI Stress Test at [facility] - 1. This study is probably normal with mild intensity attenuation in the anterior wall.  All segments are viable global left ventricular function is within normal limits other high risk indicators are not noted.  The patient has poor exercise capacity which placed the patient intermediate risk category but in light of the attenuation artifact anteriorly no definite regional perfusion defects are noted.  If the clinical suspicion of coronary disease is intermediate consider coronary CTA with FFR.        Essential hypertension 03/07/2017     Echo - 01/09/18 at [facility] - 1. Normal LV size with concentric remodeling and normal systolic function, EF ~ 60%;  No regional wall motion abnormalities. 2. Diastolic function is technically indeterminate.Normal size atria bilaterally 3. Aortic sclerosis without stenosis.  Mild to moderate regurgitation; 4. Visualized portion of the aortic root is within normal limits according to patient's body surface area. 5. Estimated peak systolic PA pressure =  24 mmHg; 6. No pericardial effusion         Fatigue 03/04/2017    Hyperlipidemia 03/04/2017     03/18/17 - MPI Stress Test at [facility] - 1. This study is probably normal with mild intensity attenuation in the anterior wall.  All segments are viable global  left ventricular function is within normal limits other high risk indicators are not noted.  The patient has poor exercise capacity which placed the patient intermediate risk category but in light of the attenuation artifact anteriorly no definite regional perfusion defects are noted.  If the clinical suspicion of coronary disease is intermediate consider coronary CTA with FFR.      Near syncope 03/04/2017    Murmur 03/03/2017     02/25/2017 Echo at [facility]  Mitral valve leaflets appear mildly thickened.  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  The mitral regurgitation jet was central. 2.  Trileaflet aortic valve.  The aortic cusps appear mildly thickened.  Aortic cusps appear mildly calcified.  The peak transaortic gradient was 17.00 mmHg.  The mean transaortic gradient was 7.00 mmHg.  Moderate aortic valve regurgitation.  Normal LV systolic function.  LV EF 62%.  Normal left ventricular wall thickness.            Review of Systems   Constitutional: Negative.   HENT: Negative.     Eyes: Negative.    Cardiovascular: Negative.    Respiratory: Negative.     Endocrine: Negative.    Hematologic/Lymphatic: Negative.    Skin: Negative.    Musculoskeletal: Negative.    Gastrointestinal: Negative.    Genitourinary: Negative.    Neurological: Negative.    Psychiatric/Behavioral: Negative.     Allergic/Immunologic: Negative.        Physical Exam  General Appearance: normal in appearance  Skin: warm, moist, no ulcers or xanthomas  Eyes: conjunctivae and lids normal, pupils are equal and round  Lips & Oral Mucosa: no pallor or cyanosis  Neck Veins: neck veins are flat, neck veins are not distended  Chest Inspection: chest is normal in appearance  Respiratory Effort: breathing comfortably, no respiratory distress  Auscultation/Percussion: lungs clear to auscultation, no rales or rhonchi, no wheezing  Cardiac Rhythm: regular rhythm and normal rate  Cardiac Auscultation: S1, S2 normal, no rub, no gallop  Murmurs: no murmur  Carotid Arteries: normal carotid upstroke bilaterally, left bruit  Lower Extremity Edema: no lower extremity edema  Abdominal Exam: soft, non-tender, no masses, bowel sounds normal  Liver & Spleen: no organomegaly  Language and Memory: patient responsive and seems to comprehend information  Neurologic Exam: neurological assessment grossly intact      Cardiovascular Studies  12-lead EKG demonstrates normal sinus rhythm, ventricular rate 72 bpm, no axis deviation, no ST segment T wave changes    Cardiovascular Health Factors  Vitals BP Readings from Last 3 Encounters:   09/15/22 (!) 152/92   08/05/22 (!) (P) 189/88   07/29/22 (!) 209/69     Wt Readings from Last 3 Encounters:   09/15/22 84 kg (185 lb 3.2 oz)   08/05/22 81.6 kg (180 lb)   07/29/22 83.9 kg (185 lb)     BMI Readings from Last 3 Encounters:   09/15/22 29.89 kg/m?   08/05/22 28.62 kg/m?   07/29/22 29.86 kg/m?      Smoking Social History     Tobacco Use   Smoking Status Never   Smokeless Tobacco Never      Lipid Profile Cholesterol   Date Value Ref Range Status   02/06/2021 162  Final     HDL   Date Value Ref Range Status   02/06/2021 62  Final     LDL   Date Value Ref Range Status   02/06/2021 77  Final     Triglycerides   Date  Value Ref Range Status   02/06/2021 115  Final      Blood Sugar No results found for: HGBA1C  Glucose   Date Value Ref Range Status   05/07/2022 98  Final   06/24/2021 103  Final   01/20/2021 92  Final          Problems Addressed Today  Encounter Diagnoses   Name Primary?    Coronary artery calcification Yes    Essential hypertension     Other hyperlipidemia     Near syncope     Nonrheumatic aortic valve insufficiency     Bilateral carotid bruits     Fatigue, unspecified type     Murmur     Screening for heart disease        Assessment and Plan     Assessment:    1.  Preoperative cardiovascular evaluation preceding procedure for lumbar spine osteoarthritis  2.  Mild aortic valve stenosis  Patient was evaluated with a 2D echo Doppler study on 05/27/2022, AV MG = 8 mmHg  3.  Systolic murmur 2/6 at the RUSB due to #2  4.  Bilateral carotid bruit  Patient was previously evaluated with a carotid artery duplex in July 2022, she was not found to have any significantly obstructive carotid artery disease  5.  Hypertension-suboptimally controlled today in the office at the previous office visits with other specialties  6.  Moderately overweight, elevated BMI = 29.89 kg/m?  7.  Coronary artery calcification  Patient did not undergo an FFR study by coronary CTA and it was not significant  Patient was previously evaluated with a perfusion imaging study-negative for ischemia  8.  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  Discontinue metoprolol  2.  Start carvedilol 25 mg  I asked the patient to take either 2 tablets p.o. every morning and 1 tablet p.o. every afternoon  Alternatively if not inconvenient she can also take 25 mg of carvedilol 3 times a day with continued blood pressure and heart rate home monitoring  3.  From a cardiac standpoint this patient is low risk to undergo the planned low back procedure, she does not have any contraindications  4.  Follow-up office visit on 01/31/2022 in our [place] office.    Total Time Today was 40 minutes in the following activities: Preparing to see the patient, Obtaining and/or reviewing separately obtained history, Performing a medically appropriate examination and/or evaluation, Counseling and educating the patient/family/caregiver, Ordering medications, tests, or procedures, Referring and communication with other health care professionals (when not separately reported), Documenting clinical information in the electronic or other health record, Independently interpreting results (not separately reported) and communicating results to the patient/family/caregiver, and Care coordination (not separately reported)          Current Medications (including today's revisions)   acetaminophen (TYLENOL) 325 mg tablet Take two tablets by mouth twice daily.    Calcium Carbonate 600 mg calcium (1,500 mg) tab Take one tablet by mouth twice daily.    CHOLEcalciferoL (vitamin D3) (VITAMIN D3) 1,000 units tablet Take one tablet by mouth daily.    lisinopriL (ZESTRIL) 20 mg tablet Take one tablet by mouth twice daily.    metoprolol tartrate (LOPRESSOR) 50 mg tablet Take one tablet by mouth twice daily.    MULTIVITAMIN PO Take 1 tablet  by mouth daily.    rosuvastatin (CRESTOR) 20 mg tablet Take one tablet by mouth daily.    Vit A,C & E-Lutein-Minerals 1,000 unit-200 mg-60 unit-2 mg tab Take 1 tablet by mouth daily.

## 2022-09-30 ENCOUNTER — Encounter: Admit: 2022-09-30 | Discharge: 2022-09-30 | Payer: MEDICARE

## 2022-10-06 ENCOUNTER — Encounter: Admit: 2022-10-06 | Discharge: 2022-10-06 | Payer: MEDICARE

## 2022-10-11 ENCOUNTER — Encounter: Admit: 2022-10-11 | Discharge: 2022-10-11 | Payer: MEDICARE

## 2022-10-19 ENCOUNTER — Encounter: Admit: 2022-10-19 | Discharge: 2022-10-19 | Payer: MEDICARE

## 2022-12-03 ENCOUNTER — Encounter: Admit: 2022-12-03 | Discharge: 2022-12-03 | Payer: MEDICARE

## 2022-12-06 ENCOUNTER — Encounter: Admit: 2022-12-06 | Discharge: 2022-12-06 | Payer: MEDICARE

## 2022-12-06 ENCOUNTER — Ambulatory Visit: Admit: 2022-12-06 | Discharge: 2022-12-06 | Payer: MEDICARE

## 2022-12-06 DIAGNOSIS — M5416 Radiculopathy, lumbar region: Secondary | ICD-10-CM

## 2022-12-06 DIAGNOSIS — R0989 Other specified symptoms and signs involving the circulatory and respiratory systems: Secondary | ICD-10-CM

## 2022-12-06 DIAGNOSIS — M5136 Other intervertebral disc degeneration, lumbar region: Secondary | ICD-10-CM

## 2022-12-06 DIAGNOSIS — I10 Essential (primary) hypertension: Secondary | ICD-10-CM

## 2022-12-06 DIAGNOSIS — M2550 Pain in unspecified joint: Secondary | ICD-10-CM

## 2022-12-06 DIAGNOSIS — R55 Syncope and collapse: Secondary | ICD-10-CM

## 2022-12-06 DIAGNOSIS — H269 Unspecified cataract: Secondary | ICD-10-CM

## 2022-12-06 DIAGNOSIS — R5383 Other fatigue: Secondary | ICD-10-CM

## 2022-12-06 DIAGNOSIS — R0600 Dyspnea, unspecified: Secondary | ICD-10-CM

## 2022-12-06 DIAGNOSIS — E785 Hyperlipidemia, unspecified: Secondary | ICD-10-CM

## 2022-12-06 DIAGNOSIS — R011 Cardiac murmur, unspecified: Secondary | ICD-10-CM

## 2022-12-06 DIAGNOSIS — D539 Nutritional anemia, unspecified: Secondary | ICD-10-CM

## 2022-12-06 MED ORDER — MIDAZOLAM 1 MG/ML IJ SOLN
1-2 mg | INTRAVENOUS | 0 refills | Status: AC | PRN
Start: 2022-12-06 — End: ?

## 2022-12-06 MED ORDER — LACTATED RINGERS IV SOLP
INTRAVENOUS | 0 refills | Status: AC
Start: 2022-12-06 — End: ?

## 2022-12-06 MED ORDER — LIDOCAINE (PF) 20 MG/ML (2 %) IJ SOLN
5 mL | Freq: Once | INTRAMUSCULAR | 0 refills | Status: AC
Start: 2022-12-06 — End: ?

## 2022-12-06 MED ORDER — FENTANYL CITRATE (PF) 50 MCG/ML IJ SOLN
50-100 ug | INTRAVENOUS | 0 refills | Status: AC | PRN
Start: 2022-12-06 — End: ?

## 2022-12-06 MED ORDER — LIDOCAINE (PF) 10 MG/ML (1 %) IJ SOLN
.2 mL | INTRAMUSCULAR | 0 refills | Status: AC | PRN
Start: 2022-12-06 — End: ?

## 2022-12-06 NOTE — Progress Notes
SPINE CENTER  INTERVENTIONAL PAIN PROCEDURE HISTORY AND PHYSICAL    Chief Complaint: Pain    HISTORY OF PRESENT ILLNESS:  back and leg pain    Medical History:   Diagnosis Date    Bilateral carotid bruits 02/05/2021    Cataract 08/26/2014    Dyspnea     Essential hypertension 07/26/2016    Fatigue 03/04/2017    Heart murmur 07/27/2015    Hyperlipemia     Hyperlipidemia 03/04/2017    Joint pain 07/27/1987    Murmur 03/03/2017    Near syncope 03/04/2017    Unspecified deficiency anemia 07/27/2011       Surgical History:   Procedure Laterality Date    HX APPENDECTOMY  07/26/1950    HX HYSTERECTOMY  05/26/1973    KNEE SURGERY  03/14/2018       family history includes Arthritis in her mother; Bleeding Disorders in her daughter.    Social History     Socioeconomic History    Marital status: Married   Tobacco Use    Smoking status: Never    Smokeless tobacco: Never   Substance and Sexual Activity    Alcohol use: Yes     Alcohol/week: 4.0 standard drinks of alcohol     Types: 4 Glasses of wine per week    Drug use: No    Sexual activity: Not Currently     Partners: Male     Birth control/protection: Post-menopausal       No Known Allergies    Vitals:    12/06/22 0800   BP: (!) 155/76   BP Source: Arm, Right Upper   Pulse: 72   Temp: 36.8 ?C (98.2 ?F)   SpO2: 98%   O2 Device: None (Room air)             REVIEW OF SYSTEMS: 10 point ROS obtained and negative except pain      PHYSICAL EXAM:unchanged        IMPRESSION:    1. Lumbar degenerative disc disease    2. Lumbar radiculopathy         PLAN: Other SCS trial

## 2022-12-06 NOTE — Procedures
INTERVENTIONAL PAIN MANAGEMENT PROCEDURE REPORT     Spinal Cord Stimulator Trial     Date of Service: 12/06/2022     Procedure Title(s):    1. Percutaneous placement of trial octad spinal cord stimulator leads x 2    2. Intraoperative fluoroscopy    Attending Surgeon: Jerilynn Som, MD    Pre-Procedure Diagnosis:   1. Lumbar degenerative disc disease    2. Lumbar radiculopathy        Post-Procedure Diagnosis:   1. Lumbar degenerative disc disease    2. Lumbar radiculopathy        Anesthesia: Local                       Anxiolysis Yes           Procedural Sedation Yes    Indications: Brianna Christensen is a 82 y.o. female with a diagnosis of   1. Lumbar degenerative disc disease  [place] [facility] SPINE ELECT ANAL [facility] NEUROSTIM    [place] AMB SPINE ELECT ANAL [facility] NEUROSTIM      2. Lumbar radiculopathy  [place] [facility]B SPINE ELECT ANAL [facility] NEUROSTIM    [place] AM[facility] SPINE ELECT ANAL [facility] NEUROSTIM        . The patient has failed more conservative measures such as physical therapy, medication management and is currently a non-operative candidate. The patient's history and physical exam were reviewed. The risks, benefits and alternatives to the procedure were discussed, and all questions were answered to the patient's satisfaction. The patient agreed to proceed, and written informed consent was obtained.     Procedure in Detail: IV was started? Yes    The patient was brought into the procedure room and placed in the prone position on the fluoroscopy table. Standard monitors were placed, and vital signs were observed throughout the procedure. An intravenous infusion of antibiotic (ancef) was started and completed prior to the start of the procedure. The area of the thoracolumbar spine was prepped with Hibaclens and draped in a sterile manner.     The t12/l1 interspace was identified. The skin and subcutaneous tissues in the area were anesthetized with 1% lidocaine. A 14-gauge Tuohy epidural needle was advanced with a paramedian approach from the left using a loss of resistance technique with a glass syringe and air to identify entrance into the epidural space. Once a good loss of resistance was obtained, negative aspiration was confirmed. A percutaneous stimulator lead was advanced under fluoroscopic guidance until the 0-position contact was at the top of t8. A second lead was placed using the same technique from the t12/l1 interspace on the right until the 0-contact was at the top of t9. The leads were connected to an external trial programmer using a sterile connector. Several combinations of lead configuration, frequency, amplitude and pulse width were used until comfortable, appropriate coverage of the patient's pain area was obtained.     The needles were then carefully removed and the leads were secured to the skin using sterile dressing and tegaderms. The connector was also wrapped in gauze and secured to the patient.    Disposition: The patient tolerated the procedure well, and there were no apparent complications. Vital signs remained stable througout the procedure. The patient was taken to the recovery area where discharge instructions for the procedure were given. The patient will return to the clinic in one week to discuss the results of this trial.    Estimated Blood Loss: minimal    Specimens:  none    Complications: none

## 2022-12-06 NOTE — Discharge Instructions - Supplementary Instructions
Post-procedure Discharge Instructions: Trial Nerve Stimulator    You may NOT drive today.    Though the procedure is generally safe, and complications are rare, we do ask that you be aware of any of the following:  Any swelling, persistent redness, new bleeding or drainage from the site of the incision.  You should not experience a severe headache.  You should not run a fever over 101degrees.  New onset of sharp, severe back and or neck pain.  New onset of upper or lower extremity numbness or weakness.  New difficulty controlling bowel or bladder function.  New shortness of breath.  * If any of these occur, please call to report this occurrence to Dr. Khan at [phone]. If you are calling after 4:00pm, on the weekends or holidays please call [phone] and ask to have the pain management resident physician on call for the physician paged or go to your local emergency room.    Do NOT shower. The bandage cannot get wet. You will have to sponge bathe only.    Do NOT remove the bandage.  If it becomes loose, it can be reinforced with tape.    Keep dressing dry.    Avoid bending, twisting, lifting arms over head or heavy lifting during the trial.    As much as possible go about your daily activities to determine if the stimulator will significantly reduce your pain and increase your ability to function.    If you are on a daily extended-release pain medication, do not stop taking it.    It is possible that you will be able to greatly reduce the amount of the short acting pain medication from what you have normally needed.    IV site: If a lump or redness occurs apply a moist compress for 10 minutes, 4 times a day for 2-3 days.  If this persists greater than 3 days notify your surgeon.    If you had sedation for your procedure:  The anesthetic may make you drowsy and slow to react for up to 12 hours.  For the next 12 hours do not consume alcohol, drive, operate machinery, sign legal documents, or work.  Rest at home today.  A responsible adult needs to stay with you today and overnight.  Start with clear liquids and advance as tolerated.  Resume all previous medication unless directed not to.    Follow up appointment in one week.  If in the event you are unable to keep your appointment, please notify the scheduler 24 hours in advance at [phone]. During your follow up appointment, you will discuss with the doctor the effectiveness of the stimulator during the trial period to determine if a permanent implantation is the direction for you.     **Please bring your stimulator supplies with you to the appointment.**

## 2022-12-06 NOTE — Procedures
Attending Surgeon: Jerilynn Som, MD    Anesthesia: Local    Pre-Procedure Diagnosis:   1. Lumbar degenerative disc disease    2. Lumbar radiculopathy        Post-Procedure Diagnosis:   1. Lumbar degenerative disc disease    2. Lumbar radiculopathy        Procedures  Estimated blood loss: none or minimal  Specimens: none  Patient tolerated the procedure well with no immediate complications. Pressure was applied, and hemostasis was accomplished.  Administrations This Visit       lactated ringers infusion       Admin Date  12/06/2022 Action  Given - New Bag Dose   Rate  20 mL/hr Route  Intravenous Documented By  Jesse Fall, RN

## 2022-12-14 ENCOUNTER — Encounter: Admit: 2022-12-14 | Discharge: 2022-12-14 | Payer: MEDICARE

## 2022-12-14 ENCOUNTER — Ambulatory Visit: Admit: 2022-12-14 | Discharge: 2022-12-14 | Payer: MEDICARE

## 2022-12-14 DIAGNOSIS — M2550 Pain in unspecified joint: Secondary | ICD-10-CM

## 2022-12-14 DIAGNOSIS — M545 Lumbar back pain: Secondary | ICD-10-CM

## 2022-12-14 DIAGNOSIS — R011 Cardiac murmur, unspecified: Secondary | ICD-10-CM

## 2022-12-14 DIAGNOSIS — E785 Hyperlipidemia, unspecified: Secondary | ICD-10-CM

## 2022-12-14 DIAGNOSIS — I10 Essential (primary) hypertension: Secondary | ICD-10-CM

## 2022-12-14 DIAGNOSIS — M5416 Radiculopathy, lumbar region: Secondary | ICD-10-CM

## 2022-12-14 DIAGNOSIS — H269 Unspecified cataract: Secondary | ICD-10-CM

## 2022-12-14 DIAGNOSIS — D539 Nutritional anemia, unspecified: Secondary | ICD-10-CM

## 2022-12-14 DIAGNOSIS — R5383 Other fatigue: Secondary | ICD-10-CM

## 2022-12-14 DIAGNOSIS — R55 Syncope and collapse: Secondary | ICD-10-CM

## 2022-12-14 DIAGNOSIS — R0989 Other specified symptoms and signs involving the circulatory and respiratory systems: Secondary | ICD-10-CM

## 2022-12-14 DIAGNOSIS — R0600 Dyspnea, unspecified: Secondary | ICD-10-CM

## 2022-12-14 NOTE — Progress Notes
[facility] CLINIC NOTE  Subjective     SUBJECTIVE:  Following up after SCS trial   Manufacturer: [employer] High Frequency (HF-10)  Duration of trial: 8 days  Location of pain: lower back   Improvement in pain: 85%  Improvement in function: Yes  Reduction of pain medication: Yes - was able to stop gabapentin   Side effects/complications: none  Patient comments: had less fatigue and felt more energy at end of day since she was not dealing with the constancy of pain       Nicotine use: No  Hemoglobin A1C (if applicable): No results found for: A1C  History of surgical infections: No       Review of Systems    Current Outpatient Medications:     acetaminophen (TYLENOL) 325 mg tablet, Take two tablets by mouth twice daily., Disp: , Rfl:     Calcium Carbonate 600 mg calcium (1,500 mg) tab, Take one tablet by mouth twice daily., Disp: , Rfl:     carvediloL (COREG) 25 mg tablet, Take one tablet by mouth twice daily with meals. Take with food.  Indications: high blood pressure, Disp: 180 tablet, Rfl: 3    CHOLEcalciferoL (vitamin D3) (VITAMIN D3) 1,000 units tablet, Take one tablet by mouth daily., Disp: , Rfl:     lisinopriL (ZESTRIL) 20 mg tablet, Take one tablet by mouth twice daily., Disp: , Rfl:     MULTIVITAMIN PO, Take 1 tablet by mouth daily., Disp: , Rfl:     rosuvastatin (CRESTOR) 20 mg tablet, Take one tablet by mouth daily., Disp: 90 tablet, Rfl: 3    Vit A,C & E-Lutein-Minerals 1,000 unit-200 mg-60 unit-2 mg tab, Take 1 tablet by mouth daily., Disp: , Rfl:     Current Facility-Administered Medications:     fentaNYL citrate PF (SUBLIMAZE) injection 50-100 mcg, 50-100 mcg, Intravenous, Q2 MIN PRN, Jerilynn Som, MD, 50 mcg at 12/06/22 1013    lidocaine PF 1% (10 mg/mL) injection 0.2 mL, 0.2 mL, Injection, PRN, Jerilynn Som, MD    midazolam (VERSED) injection 1-2 mg, 1-2 mg, Intravenous, Q2 MIN PRN, Jerilynn Som, MD, 2 mg at 12/06/22 1013  No Known Allergies  Physical Exam  General: Alert, oriented, no acute distress  Resp: Non labored breathing  Skin: lead insertion site CDI, leads x 2 pulled without difficulty and contacts intact   Vitals:    12/14/22 1044   BP: (!) 181/76   BP Source: Arm, Left Upper   Pulse: 70   Temp: 36.2 ?C (97.2 ?F)   SpO2: 98%   TempSrc: Temporal   PainSc: Four   Weight: 84.5 kg (186 lb 4.8 oz)   Height: 168.9 cm (5' 6.5)  Comment: per pt     Oswestry Total Score:: 38  Pain Score: Four  Body mass index is 29.62 kg/m?Marland Kitchen           IMPRESSION:  1. Lumbar back pain          PLAN:  Lead migration noted  X rays at end of trial with lead position at 10 and 11 vertebral bodies  Pain best controlled with stimulation at disc space (originally 9-10, with migration 11)  Patient desires permanent implant: Yes    Dressing removed, leads pulled, area cleansed with chloroprep. Bandage applied over site.   Instructed pt ok to shower if no oozing or redness at site after 2 hours.   Instructed to call if any sx of infection including fevers, swelling, drainage, increased  pain, inflammation or warmth to site.

## 2022-12-14 NOTE — Patient Instructions
Spinal Cord Stimulator General Instructions for Trial & Implant    Scheduling  The Spinal Cord Stimulator (SCS) coordinators will contact you for your appointments.   You will need to have a psychological exam prior to the procedure. (They will assist you with scheduling this.)   You will need to fill out a pre-surgical questionnaire. (This will be mailed to you.)   A nasal swab will be collected to assess for any ?germ? that can live on your skin without knowing or showing any symptoms. Specifically, we are looking for Methicillin Resistant Staphylococcus Aureus (MRSA). We are committed to reducing post-operative infections and giving our patients quality care.   No news is good news after this swab. The clinical nurse coordinator will only contact you if this result is positive.   If you have any questions in regards to insurance or scheduling feel free to call the Spinal Cord Stimulator (SCS) coordinators.  (if last name begins with A-J) at [phone]  or  (if last name begins with K-Z) at [phone].    Appointment One (SCS Trial in Procedures)    You will first be scheduled for a trial to see how the SCS will work for you.   The temporary implant will be placed in procedures, with the wires/leads being placed in your spine, while the battery is taped to your back for up to 7 days.   Your Spinal Cord Stimulator Representative will be in contact with you daily during your trial to guide you through adjusting settings if necessary.     PRE TRIAL PREP:   Do not eat or drink after midnight before the procedure.  It is REQUIRED you shower thoroughly with chlorhexidine gluconate (CHG), or more commonly known as Hibiclens? the day before and the morning of the procedure. (Hibiclens? is available at local drug stores. It typically costs $6-10). If you are allergic to this cleanse, please notify the clinical nurse coordinator. Patient instructions for CHG soap use:  Purchase the soap described above.  Wash your hair as normal with your normal shampoo and rinse.  Wash your face and genital area with regular soap and rinse.  DO NOT use CHG soap on your face, near your eyes, or on your genital area.  Apply the CHG soap to the rest of your body and your entire back. Scrub gently for 5 minutes trying to avoid rinsing before this time. You may need to turn the water off during this time to avoid rinsing.  Rinse and do not apply any other soaps, oils/lotions/creams, powder, deodorants, or perfumes on the areas you cleaned with CHG soap.   Repeat one last time on the morning of your surgery.  Patient instructions for CHG soap use:  Purchase the soap described above.  Wash your hair as normal with your normal shampoo and rinse.  Wash your face and genital area with regular soap and rinse.  DO NOT use CHG soap on your face, near your eyes, or on your genital area.  Apply the CHG soap to the rest of your body and your entire back. Scrub gently for 5 minutes trying to avoid rinsing before this time. You may need to turn the water off during this time to avoid rinsing.  Rinse and do not apply any other soaps, oils/lotions/creams, powder, deodorants, or perfumes on the areas you cleaned with CHG soap.   Repeat one last time on the morning of your surgery    POST TRIAL INSTRUCTIONS  WOUNDS   You   will not be able to shower or submerge in any water during your trial phase. Sponge baths only.  MEDICATION   If you are on any blood thinners, or take medications for chronic issues, please ask the clinical nurse coordinator for instructions on these specific medications.  ACTIVITY  Avoid bending, twisting, lifting arms over the head or heavy lifting during your trial phase.  DRIVING   You may drive during your trial phase however,   It is REQUIRED you have a driver after procedure due to anesthesia sedation.    Please contact Clinical Nurse Coordinator for questions/concerns.      Appointment Two - SCS Trial (Clinic Follow-up)    A week after the temporary implant (SCS trial), you will have an appointment, in clinic, to remove the temporary implanted leads and to assess how well it worked for you.  If the trial is successful, you will be scheduled for the permanent implant in the operating room on a separate day.   A Clinical Nurse Coordinator will review pre-operative guidelines.     Appointment Three - Permanent Implant (Surgically placed in operating room)    Your Clinical Nurse Coordinator will call you to schedule the surgery date.   Surgery consists of making 2 small incisions in your back and implant the leads in your spine and the battery under your skin. You will have staples/sutures to close the incisions and a gauze and tape dressing over the incisions.   Your Spinal Cord Stimulator Representative will be in contact with you during and after surgery  Your stimulator may not be turned on until your next appointment to allow the wound to heal.     PRE SURGERY PREP  Do not eat or drink after midnight before the procedure.  Repeat Hibiclens? the day before and the morning of the procedure (see instructions above)    POST SURGERY INSTRUCTION  WOUNDS   Keep the original surgical dressing intact. Do not remove or change, but you may reinforce.   You will NOT be able to shower or submerge in any water until your follow-up appointment in 7-10 days. Sponge baths only.  If you notice or start to experience any of the following, please notify the clinical nurse coordinator immediately. (If this is after hours, please call [phone] and have the Anesthesia Pain on call provider paged.)  Swelling  Persistent redness  Black and blue bruising  New bleeding or drainage from the insertion sites  Severe headache  Fever > 101F  New onset of sharp, severe neck or back pain  MEDICATION   You may resume any held medications 24 hours after your surgery.   ACTIVITY  Avoid bending, twisting, lifting arms over the head or heavy lifting during for 6 weeks.   DRIVING   You may drive the following day after your surgery however,   It is REQUIRED you have a driver immediately after surgery due to anesthesia sedation.      Appointment Four (Clinic Follow-up)    You will be scheduled 7-10 days after the surgery for a wound assessment and removal of the staples  It is okay for you to drive yourself that day.  The device representative will attend that appointment to initiate and educate on the programming. Bring all of the stimulator equipment with you to this appointment. (Anticipate this appointment may take as much as an hour.)    WOUNDS   The dressing and the staples will be removed  The incision will be   cleaned and redressed with steri-strips and a new gauze and tape dressing.  The steri-strips should stay on until they fall off on their own, but you may remove or change the outer guaze in 48 hours.   In 48 hours you may also begin to shower. No bathing or submerging for 3 weeks.   Do not apply any soap, lotions/creams or oils directly to the incision site unless directed to do so by your doctor or nurse practitioner.  If you notice or start to experience any of the following, please notify the clinical nurse coordinator immediately. (If this is after hours, please call [phone] and have the Anesthesia Pain on call provider paged.)  Swelling  Persistent redness  Black and blue bruising  New bleeding or drainage from the insertion sites  Severe headache  Fever > 101F  New onset of sharp, severe neck or back pain  You will follow up in 2-4 weeks to check on incision and pain control

## 2023-01-05 ENCOUNTER — Encounter: Admit: 2023-01-05 | Discharge: 2023-01-05 | Payer: MEDICARE

## 2023-01-05 ENCOUNTER — Ambulatory Visit: Admit: 2023-01-05 | Discharge: 2023-01-05 | Payer: MEDICARE

## 2023-01-05 DIAGNOSIS — M5416 Radiculopathy, lumbar region: Secondary | ICD-10-CM

## 2023-01-05 DIAGNOSIS — M545 Low back pain, unspecified back pain laterality, unspecified chronicity, unspecified whether sciatica present: Secondary | ICD-10-CM

## 2023-01-05 DIAGNOSIS — M48062 Spinal stenosis, lumbar region with neurogenic claudication: Secondary | ICD-10-CM

## 2023-01-05 DIAGNOSIS — M47816 Spondylosis without myelopathy or radiculopathy, lumbar region: Secondary | ICD-10-CM

## 2023-01-05 NOTE — Telephone Encounter
RN LVM with patient on SCS surgery date and time, as well as follow up.

## 2023-02-01 ENCOUNTER — Encounter: Admit: 2023-02-01 | Discharge: 2023-02-01 | Payer: MEDICARE

## 2023-02-01 DIAGNOSIS — G453 Amaurosis fugax: Secondary | ICD-10-CM

## 2023-02-01 DIAGNOSIS — I10 Essential (primary) hypertension: Secondary | ICD-10-CM

## 2023-02-01 DIAGNOSIS — R011 Cardiac murmur, unspecified: Secondary | ICD-10-CM

## 2023-02-01 DIAGNOSIS — M810 Age-related osteoporosis without current pathological fracture: Secondary | ICD-10-CM

## 2023-02-01 DIAGNOSIS — I2510 Atherosclerotic heart disease of native coronary artery without angina pectoris: Secondary | ICD-10-CM

## 2023-02-01 DIAGNOSIS — R0989 Other specified symptoms and signs involving the circulatory and respiratory systems: Secondary | ICD-10-CM

## 2023-02-01 DIAGNOSIS — H269 Unspecified cataract: Secondary | ICD-10-CM

## 2023-02-01 DIAGNOSIS — I6522 Occlusion and stenosis of left carotid artery: Secondary | ICD-10-CM

## 2023-02-01 DIAGNOSIS — R55 Syncope and collapse: Secondary | ICD-10-CM

## 2023-02-01 DIAGNOSIS — R0600 Dyspnea, unspecified: Secondary | ICD-10-CM

## 2023-02-01 DIAGNOSIS — E785 Hyperlipidemia, unspecified: Secondary | ICD-10-CM

## 2023-02-01 DIAGNOSIS — M1990 Unspecified osteoarthritis, unspecified site: Secondary | ICD-10-CM

## 2023-02-01 DIAGNOSIS — M2550 Pain in unspecified joint: Secondary | ICD-10-CM

## 2023-02-01 DIAGNOSIS — D539 Nutritional anemia, unspecified: Secondary | ICD-10-CM

## 2023-02-01 DIAGNOSIS — I351 Nonrheumatic aortic (valve) insufficiency: Secondary | ICD-10-CM

## 2023-02-01 DIAGNOSIS — E7849 Other hyperlipidemia: Secondary | ICD-10-CM

## 2023-02-01 DIAGNOSIS — Z136 Encounter for screening for cardiovascular disorders: Secondary | ICD-10-CM

## 2023-02-01 DIAGNOSIS — R5383 Other fatigue: Secondary | ICD-10-CM

## 2023-02-01 DIAGNOSIS — M4800 Spinal stenosis, site unspecified: Secondary | ICD-10-CM

## 2023-02-01 MED ORDER — ASPIRIN 81 MG PO TBEC
81 mg | Freq: Every day | ORAL | 0 refills | Status: AC
Start: 2023-02-01 — End: ?

## 2023-02-01 MED ORDER — CLOPIDOGREL 75 MG PO TAB
75 mg | ORAL_TABLET | Freq: Every day | ORAL | 1 refills | 90.00000 days | Status: AC
Start: 2023-02-01 — End: ?

## 2023-02-01 MED ORDER — AMLODIPINE 5 MG PO TAB
5 mg | ORAL_TABLET | Freq: Two times a day (BID) | ORAL | 1 refills | Status: AC
Start: 2023-02-01 — End: ?

## 2023-02-01 NOTE — Patient Instructions
Thank you for visiting our office today.    We would like to make the following medication adjustments:      Start taking aspirin 81 mg once daily  Start taking Clopidogril 75 mg once daily  Start taking Amlodipine 5 mg twice daily           Otherwise continue the same medications as you have been doing.          We will be pursuing the following tests after your appointment today:    Referral to vascular   Orders Placed This Encounter    aspirin EC (ASPIR-LOW) 81 mg tablet    clopiDOGreL (PLAVIX) 75 mg tablet    amLODIPine (NORVASC) 5 mg tablet         Please call us in the meantime with any questions or concerns.        Please allow 5-7 business days for our providers to review your results. All normal results will go to MyChart. If you do not have Mychart, it is strongly recommended to get this so you can easily view all your results. If you do not have mychart, we will attempt to call you once with normal lab and testing results. If we cannot reach you by phone with normal results, we will send you a letter.  If you have not heard the results of your testing after one week please give [place] a call.       Your Cardiovascular Medicine [place] Team Brett Canales, Pilar Jarvis and [place])  phone number is [phone].

## 2023-02-01 NOTE — Progress Notes
Date of Service: 02/01/2023    Brianna Christensen is a 82 y.o. female.       HPI      Brianna Christensen is a 82 y.o. white female with a history of primary hypertension, coronary artery calcification but no obstructive coronary artery disease, bilateral carotid bruit (L>R), status post evaluation with a carotid artery duplex on 10/07/2022 patient was found to have moderate stenosis at the left carotid arteries with velocities suggesting 50-69% stenosis, mild aortic valve stenosis (MG = 8 mmHg, 2D echo Doppler study performed in November 2023.        Patient also has severe back problems, she is scheduled to go to [facility] at the spine center on 03/14/2023 to undergo a spine stimulator implant.    She does not report chest pain or heart palpitations.    However patient has been experiencing for approximately 6 weeks brief episodes of vision loss in the left eye, this occurred when she is sitting down and tries to read, perhaps sometimes occurred with physical activity, they usually last a second and a disappear suddenly.    Patient was evaluated with a follow-up carotid duplex on 12/27/2022, this did not demonstrate any significant progression of carotid artery disease, the velocities were assessed to be within normal limits.          Vitals:    02/01/23 1600   BP: (!) 197/92   BP Source: Arm, Left Upper   Pulse: 71   SpO2: 99%   O2 Device: None (Room air)   PainSc: Four   Weight: 85.5 kg (188 lb 6.4 oz)   Height: 167.6 cm (5' 6)     Body mass index is 30.41 kg/m?Marland Kitchen     Past Medical History  Patient Active Problem List    Diagnosis Date Noted    Left carotid bruit 02/05/2021     A carotid artery duplex dated 10/05/2022 demonstrated left carotid velocities suggestive of 50 to 69% stenosis, this may be spuriously elevated given minimal plaque, tortuosity in this reason was present, otherwise normal left internal carotid velocities.  Normal right carotid velocities.      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 Though FFR assessment suggested no significant obstructive coronary artery disease.  03/18/17 - MPI Stress Test at [facility] - 1. This study is probably normal with mild intensity attenuation in the anterior wall.  All segments are viable global left ventricular function is within normal limits other high risk indicators are not noted.  The patient has poor exercise capacity which placed the patient intermediate risk category but in light of the attenuation artifact anteriorly no definite regional perfusion defects are noted.  If the clinical suspicion of coronary disease is intermediate consider coronary CTA with FFR.        Essential hypertension 03/07/2017     Echo - 01/09/18 at [facility] - 1. Normal LV size with concentric remodeling and normal systolic function, EF ~ 60%;  No regional wall motion abnormalities. 2. Diastolic function is technically indeterminate.Normal size atria bilaterally 3. Aortic sclerosis without stenosis.  Mild to moderate regurgitation; 4. Visualized portion of the aortic root is within normal limits according to patient's body surface area. 5. Estimated peak systolic PA pressure =  24 mmHg; 6. No pericardial effusion  Fatigue 03/04/2017    Hyperlipidemia 03/04/2017     03/18/17 - MPI Stress Test at [facility] - 1. This study is probably normal with mild intensity attenuation in the anterior wall.  All segments are viable global left ventricular function is within normal limits other high risk indicators are not noted.  The patient has poor exercise capacity which placed the patient intermediate risk category but in light of the attenuation artifact anteriorly no definite regional perfusion defects are noted.  If the clinical suspicion of coronary disease is intermediate consider coronary CTA with FFR.      Near syncope 03/04/2017    Murmur 03/03/2017     02/25/2017 Echo at [facility]  Mitral valve leaflets appear mildly thickened.  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  The mitral regurgitation jet was central. 2.  Trileaflet aortic valve.  The aortic cusps appear mildly thickened.  Aortic cusps appear mildly calcified.  The peak transaortic gradient was 17.00 mmHg.  The mean transaortic gradient was 7.00 mmHg.  Moderate aortic valve regurgitation.  Normal LV systolic function.  LV EF 62%.  Normal left ventricular wall thickness.            Review of Systems   Constitutional: Negative.   HENT: Negative.     Eyes: Negative.    Cardiovascular: Negative.    Respiratory: Negative.     Endocrine: Negative.    Hematologic/Lymphatic: Negative.    Skin: Negative.    Musculoskeletal:  Positive for back pain, falls, joint pain and myalgias.   Gastrointestinal: Negative.    Genitourinary: Negative.    Neurological: Negative.    Psychiatric/Behavioral: Negative.     Allergic/Immunologic: Negative.        Physical Exam  General Appearance: normal in appearance  Skin: warm, moist, no ulcers or xanthomas  Eyes: conjunctivae and lids normal, pupils are equal and round  Lips & Oral Mucosa: no pallor or cyanosis  Neck Veins: neck veins are flat, neck veins are not distended  Chest Inspection: chest is normal in appearance  Respiratory Effort: breathing comfortably, no respiratory distress  Auscultation/Percussion: lungs clear to auscultation, no rales or rhonchi, no wheezing  Cardiac Rhythm: regular rhythm and normal rate  Cardiac Auscultation: S1, S2 normal, no rub, no gallop  Murmurs: no murmur  Carotid Arteries: normal carotid upstroke bilaterally, bilateral carotid bruit, L>R  Lower Extremity Edema: no lower extremity edema  Abdominal Exam: soft, non-tender, no masses, bowel sounds normal  Liver & Spleen: no organomegaly  Language and Memory: patient responsive and seems to comprehend information  Neurologic Exam: neurological assessment grossly intact    Cardiovascular Studies      Cardiovascular Health Factors  Vitals BP Readings from Last 3 Encounters:   02/01/23 (!) 197/92   12/14/22 (!) 181/76   12/06/22 (!) 152/119     Wt Readings from Last 3 Encounters:   02/01/23 85.5 kg (188 lb 6.4 oz)   12/14/22 84.5 kg (186 lb 4.8 oz)   12/06/22 83.9 kg (184 lb 15.5 oz)     BMI Readings from Last 3 Encounters:   02/01/23 30.41 kg/m?   12/14/22 29.62 kg/m?   12/06/22 29.85 kg/m?      Smoking Social History     Tobacco Use   Smoking Status Never   Smokeless Tobacco Never      Lipid Profile Cholesterol   Date Value Ref Range Status   02/06/2021 162  Final     HDL   Date Value Ref Range Status  02/06/2021 62  Final     LDL   Date Value Ref Range Status   02/06/2021 77  Final     Triglycerides   Date Value Ref Range Status   02/06/2021 115  Final      Blood Sugar No results found for: HGBA1C  Glucose   Date Value Ref Range Status   05/07/2022 98  Final   06/24/2021 103  Final   01/20/2021 92  Final          Problems Addressed Today  Encounter Diagnoses   Name Primary?    Nonrheumatic aortic valve insufficiency Yes    Near syncope     Other hyperlipidemia     Essential hypertension     Coronary artery calcification     Left carotid bruit     Fatigue, unspecified type     Left carotid artery stenosis     Bilateral carotid bruits     Spinal stenosis, unspecified spinal region     Murmur     Screening for heart disease     Amaurosis fugax        Assessment and Plan     Assessment:    1.  Carotid artery disease, bilateral carotid bruit  On the physical exam left bruit louder than right  2.  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  Accuracy of this report  3.  Primary hypertension-suboptimally controlled, patient is on carvedilol and lisinopril  4.  Likely symptoms of amaurosis fugax due to carotid artery disease  5.  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  6.  Mild aortic valve stenosis, MG = 8 mmHg, 2D echo Doppler study per formed in November 2023  7.  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   8.  Coronary artery calcification  Patient did undergo an FFR study by the coronary CTA, it was not significant  Perfusion imaging study performed in August 2018-negative for ischemia, LVEF = 66%  9.  Hyperlipidemia-on statin therapy    Plan:    1.  I suggest the following changes in her medications, add:  Amlodipine 5 mg p.o. twice daily  Aspirin 81 mg p.o. nightly  Clopidogrel 75 mg p.o. nightly  2.  Further evaluation in vascular clinic by Dr. Chales Abrahams  3.  Follow-up with me in approximately 6 months.    Total Time Today was 40 minutes in the following activities: Preparing to see the patient, Obtaining and/or reviewing separately obtained history, Performing a medically appropriate examination and/or evaluation, Counseling and educating the patient/family/caregiver, Ordering medications, tests, or procedures, Referring and communication with other health care professionals (when not separately reported), Documenting clinical information in the electronic or other health record, and Independently interpreting results (not separately reported) and communicating results to the patient/family/caregiver            Current Medications (including today's revisions)   acetaminophen (TYLENOL) 325 mg tablet Take two tablets by mouth twice daily.    alendronate (FOSAMAX) 70 mg tablet Take one tablet by mouth every 7 days.    Calcium Carbonate 600 mg calcium (1,500 mg) tab Take one tablet by mouth twice daily.    carvediloL (COREG) 25 mg tablet Take one tablet by mouth twice daily with meals. Take with food.  Indications: high blood pressure  CHOLEcalciferoL (vitamin D3) (VITAMIN D3) 1,000 units tablet Take one tablet by mouth daily.    gabapentin (NEURONTIN) 300 mg capsule Take one capsule by mouth twice daily.    lisinopriL (ZESTRIL) 20 mg tablet Take one tablet by mouth twice daily.    MULTIVITAMIN PO Take 1 tablet by mouth daily.    rosuvastatin (CRESTOR) 20 mg tablet Take one tablet by mouth daily.    Vit A,C & E-Lutein-Minerals 1,000 unit-200 mg-60 unit-2 mg tab Take 1 tablet by mouth daily.

## 2023-02-02 ENCOUNTER — Encounter: Admit: 2023-02-02 | Discharge: 2023-02-02 | Payer: MEDICARE

## 2023-02-02 NOTE — Progress Notes
Sent notice of new medications prescribed to Brianna Kearns, RN of Dr. Milta Deiters nursing team. At office visit with Dr. Avie Arenas on 02/01/23 patient was started on aspirin 81 mg daily and plavix 75 mg daily. Dr. Avie Arenas wanted him informed of this because the patient is scheduled for a spinal column stimulator insertion in August.      Dr. Letitia Neri recommendations for holding medications for procedure: patient can hold plavix for 7 days prior to procedure but would like her to continue aspirin without holding.

## 2023-02-03 ENCOUNTER — Encounter: Admit: 2023-02-03 | Discharge: 2023-02-03 | Payer: MEDICARE

## 2023-02-07 ENCOUNTER — Encounter: Admit: 2023-02-07 | Discharge: 2023-02-07 | Payer: MEDICARE

## 2023-02-08 ENCOUNTER — Encounter: Admit: 2023-02-08 | Discharge: 2023-02-08 | Payer: MEDICARE

## 2023-02-08 NOTE — Telephone Encounter
02-08-2023 Per Task Message, request faxed to [facility], (F) [phone], clp    Please STAT request cardiovascular testing from [facility] - Ph: [phone].

## 2023-02-10 ENCOUNTER — Encounter: Admit: 2023-02-10 | Discharge: 2023-02-10 | Payer: MEDICARE

## 2023-02-10 NOTE — Telephone Encounter
Pt called, RN returned call LVM. She is inquiring about getting some pain medication. We do not prescribe at this time. Rn stated that it be best to discuss with Zohra, please call to schedule a telehealth.

## 2023-02-10 NOTE — Telephone Encounter
Patient called with concerns of low blood pressure after increasing amlodipine 5 mg once a day to 5 mg twice a day at her last office visit.

## 2023-02-19 ENCOUNTER — Encounter: Admit: 2023-02-19 | Discharge: 2023-02-19 | Payer: MEDICARE

## 2023-02-19 DIAGNOSIS — I10 Essential (primary) hypertension: Secondary | ICD-10-CM

## 2023-02-19 DIAGNOSIS — R0989 Other specified symptoms and signs involving the circulatory and respiratory systems: Secondary | ICD-10-CM

## 2023-02-19 NOTE — Progress Notes
URGENT/STAT    Request for the following medical records [facility] for purpose of continuity of care. Patient has an upcoming cardiology appointment.     Patient: Brianna Christensen  DOB: 1941-07-24    Please Fax:    Most recent office visit note (any visits since 10/2022)  Most recent lipid/Comprehensive Metabolic Panel results    Please fax to: [phone]  Cardiology Services at the [facility]  Attention: Medical Records/Dr Chales Abrahams    Thank you

## 2023-02-24 ENCOUNTER — Ambulatory Visit: Admit: 2023-02-24 | Discharge: 2023-02-24 | Payer: MEDICARE

## 2023-02-24 ENCOUNTER — Encounter: Admit: 2023-02-24 | Discharge: 2023-02-24 | Payer: MEDICARE

## 2023-02-24 DIAGNOSIS — E785 Hyperlipidemia, unspecified: Secondary | ICD-10-CM

## 2023-02-24 DIAGNOSIS — I6522 Occlusion and stenosis of left carotid artery: Secondary | ICD-10-CM

## 2023-02-24 DIAGNOSIS — R0989 Other specified symptoms and signs involving the circulatory and respiratory systems: Secondary | ICD-10-CM

## 2023-02-24 DIAGNOSIS — R5383 Other fatigue: Secondary | ICD-10-CM

## 2023-02-24 DIAGNOSIS — M2550 Pain in unspecified joint: Secondary | ICD-10-CM

## 2023-02-24 DIAGNOSIS — R55 Syncope and collapse: Secondary | ICD-10-CM

## 2023-02-24 DIAGNOSIS — R0600 Dyspnea, unspecified: Secondary | ICD-10-CM

## 2023-02-24 DIAGNOSIS — R011 Cardiac murmur, unspecified: Secondary | ICD-10-CM

## 2023-02-24 DIAGNOSIS — H269 Unspecified cataract: Secondary | ICD-10-CM

## 2023-02-24 DIAGNOSIS — I10 Essential (primary) hypertension: Secondary | ICD-10-CM

## 2023-02-24 DIAGNOSIS — M810 Age-related osteoporosis without current pathological fracture: Secondary | ICD-10-CM

## 2023-02-24 DIAGNOSIS — G459 Transient cerebral ischemic attack, unspecified: Secondary | ICD-10-CM

## 2023-02-24 DIAGNOSIS — M1990 Unspecified osteoarthritis, unspecified site: Secondary | ICD-10-CM

## 2023-02-24 DIAGNOSIS — D539 Nutritional anemia, unspecified: Secondary | ICD-10-CM

## 2023-02-24 MED ORDER — EZETIMIBE 10 MG PO TAB
10 mg | ORAL_TABLET | Freq: Every day | ORAL | 1 refills | Status: AC
Start: 2023-02-24 — End: ?

## 2023-02-24 MED ORDER — CLOPIDOGREL 75 MG PO TAB
75 mg | ORAL_TABLET | Freq: Every day | ORAL | 1 refills | 90.00000 days | Status: DC
Start: 2023-02-24 — End: 2023-02-24

## 2023-02-24 NOTE — Progress Notes
Ambulatory (External) Cardiac Monitor Enrollment Record     Placement Location: Clinic Placement  Clinic Location: MPB5  Vendor: [facility] Cardiac Telemetry (MCOT/MCT)?: Yes  Duration of Monitor (in days): 30  Monitor Diagnosis: Transient Ischemic Attack (TIA) (G45.9)  Secondary Monitor Diagnosis: Near Syncope (A54)  Ordering Provider: Harley Alto, MD  AMB Monitor Serial Number: [id number] clinic  No data recorded    Start Time and Date: 02/24/23 5:08 PM   Patient Name: Brianna Christensen  DOB: [birth year]  MRN: [id number]  Sex: female  Mobile Phone Number: [phone] (mobile)  Home Phone Number: [phone]  Patient Address: [address] [place] [address]  Insurance Coverage: [employer] PFFS  Insurance ID: [id number]  Insurance Group #: [id number]  Insurance Subscriber: [place]  Implanted Cardiac Device Information: No results found for: [id number]      Patient instructed to contact company phone number on the monitor box with questions regarding billing, placement, troubleshooting.     Foye Clock Jamerius Boeckman    ____________________________________________________________    Clinic Staff:    Complete additional steps for documentation double check/Co-Sign.  In Follow-up, send chart upon closing encounter to [facility] HRM AMBULATORY MONITORS    HRM Ambulatory Monitoring Team:  Schedule on appropriate template and check-in.   Clinic Placement Schedule on clinic location [facility] schedule   Home Enrollment Schedule on Home Enrollment schedule ([facility])   Given to patient in clinic for self-placement Schedule on Home Enrollment schedule ([facility])   Inpatient Schedule on [facility] AMBULATORY MONITORING template   2. Please enroll with appropriate vendor.

## 2023-02-24 NOTE — Patient Instructions
Follow-Up:    -Thank you for allowing [place] to participate in your care today. Your After Visit Summary is being completed by Geronimo Boot, RN.    -We would like you to follow up in  6 months with Harley Alto, MD  -The schedule is released approximately 4-5 months in advance. You will be called by our scheduling department to make an appointment and you will also receive a notification via MyChart to self-schedule.  However, if you would like to call to make this appointment, please call [phone].    -Please schedule the following testing at check out, or by calling our scheduling line: MONITOR    Changes From Today's Office Visit   We will place your 30 day event monitor for you today. We will call you with these results once Dr. Chales Abrahams has had a chance to review them, please provide 8-10 business days for review    Please see if you can get your CTA of your head and neck done locally and have them send [place] the results to [phone]   Follow up with Dr. Chales Abrahams in 6 months via telehealth     Contacting our office:    -Business Hours: Monday-Friday, 8:00 am-4:30 pm (excluding Holidays).     -For medical questions or concerns, please send [place] a message through your MyChart account or call the [facility] team nursing triage line at [phone]. Please leave a detailed message with your name, date of birth, and reason for your call.  If your message is received before 3:30pm, every effort will be made to call you back the same day.  Please allow time for [place] to review your chart prior to call back.     -For medication refills please start by contacting your pharmacy. You can also send [place] a prescription question through your MyChart or call the nurse triage line above.     -Should you have an immediate need of the weekend/nights and holidays, please call our on-call triage line at [phone].    [facility] nursing team fax number: [phone]    -You may receive a survey in the upcoming weeks from [employer] [facility]. Your feedback is important to [place] and helps [place] continue to improve patient care and patient satisfaction.     -Please feel free to call our Financial Department at [phone] with any questions or concerns about estimated cost of testing or imaging ordered today. We are happy to provide CPT codes upon request.    Results & Testing Follow Up:    -Please allow 5-7 business days for the results of any testing to be reviewed. Please call our office if you have not heard from a nurse within this time frame.    -Should you choose to complete testing at an outside facility, please contact our office after completion of testing so that we can ensure that we have received results for your provider to review.    Lab and test results:  As a part of the CARES act, starting 10/25/2019, some results will be released to you via MyChart immediately and automatically.  You may see results before your provider sees them; however, your provider will review all these results and then they, or one of their team, will notify you of result information and recommendations.   Critical results will be addressed immediately, but otherwise, please allow [place] time to get back with you prior to you reaching out to [place] for questions.  This will usually take about  72 hours for labs and 5-7 days for procedure test results.

## 2023-02-28 ENCOUNTER — Encounter: Admit: 2023-02-28 | Discharge: 2023-02-28 | Payer: MEDICARE

## 2023-02-28 DIAGNOSIS — I10 Essential (primary) hypertension: Secondary | ICD-10-CM

## 2023-03-01 ENCOUNTER — Encounter: Admit: 2023-03-01 | Discharge: 2023-03-01 | Payer: MEDICARE

## 2023-03-01 NOTE — Telephone Encounter
Pt called to ask about nail polish and wearing jewelry during the procedure. Told her best to have jewelry off but she stated her wedding band is hard to get off, they will take care of it at surgery center or tape it. She will stick with clear nil polish as well.

## 2023-03-05 ENCOUNTER — Encounter: Admit: 2023-03-05 | Discharge: 2023-03-05 | Payer: MEDICARE

## 2023-03-07 ENCOUNTER — Encounter: Admit: 2023-03-07 | Discharge: 2023-03-07 | Payer: MEDICARE

## 2023-03-07 NOTE — Telephone Encounter
The patient called and left a VM requesting a call back. This RN returned her call and the patient said she was calling because her scheduled SCS surgery was cancelled. She said [facility] cancelled her on Saturday 8/10 because she was wearing a heart monitor. She said she called her heart doctor, Dr. Chales Abrahams and he said she could remove the heart monitor for the procedure. She does not know why she is wearing the monitor, only that she is to wear it for the specified time and then return it for evaluation. This RN reached out to the Spine Center supervisor for advice in where to take this situation next.

## 2023-03-08 ENCOUNTER — Encounter: Admit: 2023-03-08 | Discharge: 2023-03-08 | Payer: MEDICARE

## 2023-03-08 NOTE — Telephone Encounter
This RN called the patient to follow-up on why her procedure had been cancelled. Explained to the patient that Dr. Park Breed had cancelled her procedure because she had had a TIA and that was why she was wearing a heart monitor. Also explained that Dr. Santo Held nurse would need to discuss with Dr. [facility] Breed on Thursday the next steps in her pain management care. Also that it was very likely that the procedure would not be rescheduled until the heart monitor data was analyzed by cardiology and clearance was given. The patient was disappointed but voiced understanding.

## 2023-03-10 ENCOUNTER — Encounter: Admit: 2023-03-10 | Discharge: 2023-03-10 | Payer: MEDICARE

## 2023-03-10 NOTE — Telephone Encounter
RN called patient to let her know that both Dr Chales Abrahams and Dr Welton Flakes are in contact with one another and are waiting for test results. They will regroup and decide on SCS surgery. I also explained to the patient that once we know,the surgery might have to be authorized to be done at the main campus instead of [facility]. Pt verbalized understanding and we will get back with her next week once a plan is formed.

## 2023-03-11 ENCOUNTER — Encounter: Admit: 2023-03-11 | Discharge: 2023-03-11 | Payer: MEDICARE

## 2023-03-14 ENCOUNTER — Encounter: Admit: 2023-03-14 | Discharge: 2023-03-14 | Payer: MEDICARE

## 2023-03-14 NOTE — Telephone Encounter
RN called to let pt know that I have tentatively put her on the schedule for the SCS surgery at [facility] on  04/04/23 @8am . Will call to verify when it is certain.

## 2023-03-17 ENCOUNTER — Encounter: Admit: 2023-03-17 | Discharge: 2023-03-17 | Payer: MEDICARE

## 2023-03-17 MED ORDER — AMLODIPINE 5 MG PO TAB
5 mg | ORAL_TABLET | Freq: Two times a day (BID) | ORAL | 11 refills | Status: AC
Start: 2023-03-17 — End: ?

## 2023-03-29 ENCOUNTER — Encounter: Admit: 2023-03-29 | Discharge: 2023-03-29 | Payer: MEDICARE

## 2023-03-30 ENCOUNTER — Encounter: Admit: 2023-03-30 | Discharge: 2023-03-30 | Payer: MEDICARE

## 2023-03-30 DIAGNOSIS — R0600 Dyspnea, unspecified: Secondary | ICD-10-CM

## 2023-03-30 DIAGNOSIS — M2550 Pain in unspecified joint: Secondary | ICD-10-CM

## 2023-03-30 DIAGNOSIS — I10 Essential (primary) hypertension: Secondary | ICD-10-CM

## 2023-03-30 DIAGNOSIS — R5383 Other fatigue: Secondary | ICD-10-CM

## 2023-03-30 DIAGNOSIS — H269 Unspecified cataract: Secondary | ICD-10-CM

## 2023-03-30 DIAGNOSIS — M810 Age-related osteoporosis without current pathological fracture: Secondary | ICD-10-CM

## 2023-03-30 DIAGNOSIS — R011 Cardiac murmur, unspecified: Secondary | ICD-10-CM

## 2023-03-30 DIAGNOSIS — R55 Syncope and collapse: Secondary | ICD-10-CM

## 2023-03-30 DIAGNOSIS — E785 Hyperlipidemia, unspecified: Secondary | ICD-10-CM

## 2023-03-30 DIAGNOSIS — R0989 Other specified symptoms and signs involving the circulatory and respiratory systems: Secondary | ICD-10-CM

## 2023-03-30 DIAGNOSIS — D539 Nutritional anemia, unspecified: Secondary | ICD-10-CM

## 2023-03-30 DIAGNOSIS — M1990 Unspecified osteoarthritis, unspecified site: Secondary | ICD-10-CM

## 2023-04-01 ENCOUNTER — Encounter: Admit: 2023-04-01 | Discharge: 2023-04-01 | Payer: MEDICARE

## 2023-04-04 ENCOUNTER — Encounter: Admit: 2023-04-04 | Discharge: 2023-04-04 | Payer: MEDICARE

## 2023-04-04 ENCOUNTER — Ambulatory Visit: Admit: 2023-04-04 | Discharge: 2023-04-04 | Payer: MEDICARE

## 2023-04-04 DIAGNOSIS — I10 Essential (primary) hypertension: Secondary | ICD-10-CM

## 2023-04-04 DIAGNOSIS — M2550 Pain in unspecified joint: Secondary | ICD-10-CM

## 2023-04-04 DIAGNOSIS — H269 Unspecified cataract: Secondary | ICD-10-CM

## 2023-04-04 DIAGNOSIS — R55 Syncope and collapse: Secondary | ICD-10-CM

## 2023-04-04 DIAGNOSIS — R5383 Other fatigue: Secondary | ICD-10-CM

## 2023-04-04 DIAGNOSIS — M810 Age-related osteoporosis without current pathological fracture: Secondary | ICD-10-CM

## 2023-04-04 DIAGNOSIS — E785 Hyperlipidemia, unspecified: Secondary | ICD-10-CM

## 2023-04-04 DIAGNOSIS — M1990 Unspecified osteoarthritis, unspecified site: Secondary | ICD-10-CM

## 2023-04-04 DIAGNOSIS — D539 Nutritional anemia, unspecified: Secondary | ICD-10-CM

## 2023-04-04 DIAGNOSIS — R011 Cardiac murmur, unspecified: Secondary | ICD-10-CM

## 2023-04-04 DIAGNOSIS — R0600 Dyspnea, unspecified: Secondary | ICD-10-CM

## 2023-04-04 DIAGNOSIS — R0989 Other specified symptoms and signs involving the circulatory and respiratory systems: Secondary | ICD-10-CM

## 2023-04-04 MED ORDER — FENTANYL CITRATE (PF) 50 MCG/ML IJ SOLN
INTRAVENOUS | 0 refills | Status: DC
Start: 2023-04-04 — End: 2023-04-04

## 2023-04-04 MED ORDER — HYDROCODONE-ACETAMINOPHEN 5-325 MG PO TAB
1 | ORAL_TABLET | ORAL | 0 refills | 30.00000 days | Status: AC | PRN
Start: 2023-04-04 — End: ?

## 2023-04-04 MED ORDER — PROPOFOL 10 MG/ML IV EMUL 20 ML (INFUSION)(AM)(OR)
INTRAVENOUS | 0 refills | Status: DC
Start: 2023-04-04 — End: 2023-04-04

## 2023-04-04 MED ORDER — CEFAZOLIN 1 GRAM IJ SOLR
INTRAVENOUS | 0 refills | Status: DC
Start: 2023-04-04 — End: 2023-04-04

## 2023-04-04 MED ORDER — CEPHALEXIN 500 MG PO CAP
500 mg | ORAL_CAPSULE | Freq: Four times a day (QID) | ORAL | 0 refills | Status: AC
Start: 2023-04-04 — End: ?

## 2023-04-05 ENCOUNTER — Encounter: Admit: 2023-04-05 | Discharge: 2023-04-05 | Payer: MEDICARE

## 2023-04-05 DIAGNOSIS — R5383 Other fatigue: Secondary | ICD-10-CM

## 2023-04-05 DIAGNOSIS — R0600 Dyspnea, unspecified: Secondary | ICD-10-CM

## 2023-04-05 DIAGNOSIS — H269 Unspecified cataract: Secondary | ICD-10-CM

## 2023-04-05 DIAGNOSIS — R55 Syncope and collapse: Secondary | ICD-10-CM

## 2023-04-05 DIAGNOSIS — I10 Essential (primary) hypertension: Secondary | ICD-10-CM

## 2023-04-05 DIAGNOSIS — M2550 Pain in unspecified joint: Secondary | ICD-10-CM

## 2023-04-05 DIAGNOSIS — M1990 Unspecified osteoarthritis, unspecified site: Secondary | ICD-10-CM

## 2023-04-05 DIAGNOSIS — R011 Cardiac murmur, unspecified: Secondary | ICD-10-CM

## 2023-04-05 DIAGNOSIS — D539 Nutritional anemia, unspecified: Secondary | ICD-10-CM

## 2023-04-05 DIAGNOSIS — R0989 Other specified symptoms and signs involving the circulatory and respiratory systems: Secondary | ICD-10-CM

## 2023-04-05 DIAGNOSIS — E785 Hyperlipidemia, unspecified: Secondary | ICD-10-CM

## 2023-04-05 DIAGNOSIS — M810 Age-related osteoporosis without current pathological fracture: Secondary | ICD-10-CM

## 2023-04-11 ENCOUNTER — Encounter: Admit: 2023-04-11 | Discharge: 2023-04-11 | Payer: MEDICARE

## 2023-04-11 NOTE — Telephone Encounter
-----   Message from Selinda Flavin, MD sent at 04/08/2023  3:43 PM CDT -----  Please call the patient and let her know that the event monitor (I think it was ordered by Dr. Chales Abrahams) demonstrated that the rhythm was normal for the most part with a average heart rate of 74 bpm, it was no atrial fibrillation, she did have some extra beats that amounted to less than 1% burden of the total number of beats.    Will continue to monitor this.    Thank you  ----- Message -----  From: Harley Alto, MD  Sent: 04/04/2023  12:29 PM CDT  To: Dorris Fetch, MD      ----- Message -----  From: Harley Alto, MD  Sent: 04/04/2023  12:22 PM CDT  To: Harley Alto, MD

## 2023-04-11 NOTE — Telephone Encounter
Left VM with results and recommendations.  Left callback number for any questions or concerns.

## 2023-04-14 ENCOUNTER — Encounter: Admit: 2023-04-14 | Discharge: 2023-04-14 | Payer: MEDICARE

## 2023-04-14 ENCOUNTER — Ambulatory Visit: Admit: 2023-04-14 | Discharge: 2023-04-14 | Payer: MEDICARE

## 2023-04-14 DIAGNOSIS — I10 Essential (primary) hypertension: Secondary | ICD-10-CM

## 2023-04-14 DIAGNOSIS — M2550 Pain in unspecified joint: Secondary | ICD-10-CM

## 2023-04-14 DIAGNOSIS — M1990 Unspecified osteoarthritis, unspecified site: Secondary | ICD-10-CM

## 2023-04-14 DIAGNOSIS — R011 Cardiac murmur, unspecified: Secondary | ICD-10-CM

## 2023-04-14 DIAGNOSIS — H269 Unspecified cataract: Secondary | ICD-10-CM

## 2023-04-14 DIAGNOSIS — R0989 Other specified symptoms and signs involving the circulatory and respiratory systems: Secondary | ICD-10-CM

## 2023-04-14 DIAGNOSIS — R55 Syncope and collapse: Secondary | ICD-10-CM

## 2023-04-14 DIAGNOSIS — E785 Hyperlipidemia, unspecified: Secondary | ICD-10-CM

## 2023-04-14 DIAGNOSIS — D539 Nutritional anemia, unspecified: Secondary | ICD-10-CM

## 2023-04-14 DIAGNOSIS — M810 Age-related osteoporosis without current pathological fracture: Secondary | ICD-10-CM

## 2023-04-14 DIAGNOSIS — R5383 Other fatigue: Secondary | ICD-10-CM

## 2023-04-14 DIAGNOSIS — R0600 Dyspnea, unspecified: Secondary | ICD-10-CM

## 2023-04-20 ENCOUNTER — Encounter: Admit: 2023-04-20 | Discharge: 2023-04-20 | Payer: MEDICARE

## 2023-04-20 NOTE — Telephone Encounter
Pt called, RN returned call. Reviewed after care instructions with the dressing, showering etc. Pt understood.

## 2023-05-03 ENCOUNTER — Encounter: Admit: 2023-05-03 | Discharge: 2023-05-03 | Payer: MEDICARE

## 2023-05-17 ENCOUNTER — Ambulatory Visit: Admit: 2023-05-17 | Discharge: 2023-05-18 | Payer: MEDICARE

## 2023-05-17 ENCOUNTER — Encounter: Admit: 2023-05-17 | Discharge: 2023-05-17 | Payer: MEDICARE

## 2023-05-17 DIAGNOSIS — R0989 Other specified symptoms and signs involving the circulatory and respiratory systems: Secondary | ICD-10-CM

## 2023-05-17 DIAGNOSIS — M810 Age-related osteoporosis without current pathological fracture: Secondary | ICD-10-CM

## 2023-05-17 DIAGNOSIS — R5383 Other fatigue: Secondary | ICD-10-CM

## 2023-05-17 DIAGNOSIS — D539 Nutritional anemia, unspecified: Secondary | ICD-10-CM

## 2023-05-17 DIAGNOSIS — R55 Syncope and collapse: Secondary | ICD-10-CM

## 2023-05-17 DIAGNOSIS — R0600 Dyspnea, unspecified: Secondary | ICD-10-CM

## 2023-05-17 DIAGNOSIS — R011 Cardiac murmur, unspecified: Secondary | ICD-10-CM

## 2023-05-17 DIAGNOSIS — M1990 Unspecified osteoarthritis, unspecified site: Secondary | ICD-10-CM

## 2023-05-17 DIAGNOSIS — H269 Unspecified cataract: Secondary | ICD-10-CM

## 2023-05-17 DIAGNOSIS — M2550 Pain in unspecified joint: Secondary | ICD-10-CM

## 2023-05-17 DIAGNOSIS — E785 Hyperlipidemia, unspecified: Secondary | ICD-10-CM

## 2023-05-17 DIAGNOSIS — M5416 Radiculopathy, lumbar region: Secondary | ICD-10-CM

## 2023-05-17 DIAGNOSIS — I10 Essential (primary) hypertension: Secondary | ICD-10-CM

## 2023-05-17 DIAGNOSIS — M7918 Myalgia, other site: Secondary | ICD-10-CM

## 2023-05-17 NOTE — Progress Notes
SPINE CENTER CLINIC NOTE       SUBJECTIVE:   Chronic back pain   S/p nevro implant   This has moderately helped her back     New onset aching pain down the left posterior thigh with some numbness   This is new in the last month   Starts in the buttock and radiates  Worse standing and walking   Massaging helps  Ice helps   Sleeps ok at night     Saw PCP   Had hip imaging - unremarkable for this pain   Was advised to start PT but no orders given   Also advised to f.u to discuss interventions with our team    TRX   NEVRO SCS - 75% improvement in lower back pain     MEDS   Gabapentin 1-2 a day (has reduced in the last month)           Review of Systems    Current Outpatient Medications:     acetaminophen (TYLENOL) 325 mg tablet, Take two tablets by mouth twice daily., Disp: , Rfl:     alendronate (FOSAMAX) 70 mg tablet, Take one tablet by mouth every 7 days., Disp: , Rfl:     amLODIPine (NORVASC) 5 mg tablet, TAKE ONE TABLET BY MOUTH TWICE DAILY, Disp: 60 tablet, Rfl: 11    aspirin EC (ASPIR-LOW) 81 mg tablet, Take one tablet by mouth daily., Disp: , Rfl:     Calcium Carbonate 600 mg calcium (1,500 mg) tab, Take one tablet by mouth twice daily., Disp: , Rfl:     carvediloL (COREG) 25 mg tablet, Take one tablet by mouth twice daily with meals. Take with food.  Indications: high blood pressure, Disp: 180 tablet, Rfl: 3    cephalexin (KEFLEX) 500 mg capsule, Take one capsule by mouth four times daily., Disp: 28 capsule, Rfl: 0    CHOLEcalciferoL (vitamin D3) (VITAMIN D3) 1,000 units tablet, Take one tablet by mouth daily., Disp: , Rfl:     ezetimibe (ZETIA) 10 mg tablet, Take one tablet by mouth daily., Disp: 90 tablet, Rfl: 1    gabapentin (NEURONTIN) 300 mg capsule, Take one capsule by mouth twice daily., Disp: , Rfl:     HYDROcodone/acetaminophen (NORCO) 5/325 mg tablet, Take one tablet by mouth every 6 hours as needed for Pain.  Max 8 tabs/day, Disp: 15 tablet, Rfl: 0    lisinopriL (ZESTRIL) 20 mg tablet, Take one tablet by mouth twice daily., Disp: , Rfl:     MULTIVITAMIN PO, Take 1 tablet by mouth daily., Disp: , Rfl:     rosuvastatin (CRESTOR) 20 mg tablet, Take one tablet by mouth daily., Disp: 90 tablet, Rfl: 3    Vit A,C & E-Lutein-Minerals 1,000 unit-200 mg-60 unit-2 mg tab, Take 1 tablet by mouth daily., Disp: , Rfl:     Current Facility-Administered Medications:     lidocaine PF 1% (10 mg/mL) injection 0.2 mL, 0.2 mL, Injection, PRN, Jerilynn Som, MD  No Known Allergies  Physical Exam  Vitals:    05/17/23 1023   BP: (!) 147/75   Pulse: 74   Temp: 36.5 ?C (97.7 ?F)   SpO2: 98%   TempSrc: Skin   PainSc: Zero   Weight: 83 kg (183 lb)   Height: 167.6 cm (5' 6)  Comment: per pt     Oswestry Total Score:: 26  Pain Score: Zero  Body mass index is 29.54 kg/m?Marland Kitchen    General: Alert, oriented, mild distress  HEENT: normocephalic  NECK: TTP along c spine and paracerv muscles. Limited ROM  Resp: Non labored breathing, no distress  Cardio: Pedal pulses palpable bilaterally, mild lower extremity edema  MS: TTP mild in lumbosacral, moderate in left gluteal area.  NEURO: Cranial nerves II-XII intact. Motor strength 5+ throughout, sensory exams with def to L5 thigh. Gait antalgic, but improved. Cane for assist.   Behavior: Calm, cooperative, behavior and speech normal.          IMPRESSION:  1. Lumbar radiculopathy    2. Myalgia, other site          PLAN:    Medications: continue gabapentin, advised BID routinely to help new sensory pain   Imaging: no new imaging at this time - will request recent x rays from PCP  Reviewed Past MRI   Does have l3 and l4 moderate compresison. No major abnormality at L5-S1 from past MRI   Procedure/Surgery/Referral: IM cortisone inj TPI today   PT/OT Recommendations: Plan to start conditioning PT, sent to [facility], she likely has been having some compensatory change secondary to her pain improvement and increase in mobility which could be why this area hurts more    Stim rep Almira Coaster present with Nevro to analyze stim settings and optimize stim programming  She will defer from changing until 1 week later pending her response to TPI   High Lead impedance noted on upper contacts on analysis, low lead impedance on lower contacts  If not better with conservative measures, consider new MRI

## 2023-06-08 ENCOUNTER — Encounter: Admit: 2023-06-08 | Discharge: 2023-06-08 | Payer: MEDICARE

## 2023-07-03 ENCOUNTER — Encounter: Admit: 2023-07-03 | Discharge: 2023-07-03 | Payer: MEDICARE

## 2023-07-04 ENCOUNTER — Ambulatory Visit: Admit: 2023-07-04 | Discharge: 2023-07-05 | Payer: MEDICARE

## 2023-07-04 ENCOUNTER — Encounter: Admit: 2023-07-04 | Discharge: 2023-07-04 | Payer: MEDICARE

## 2023-07-04 DIAGNOSIS — M5416 Radiculopathy, lumbar region: Secondary | ICD-10-CM

## 2023-07-04 NOTE — Progress Notes
Todays visit took place via face-to-face encounter utilizing Zoom application. Total time 20 minutes.      SPINE CENTER CLINIC NOTE       SUBJECTIVE: Chronic back pain   S/p nevro implant   This has moderately helped her back   Had a new flare   This is 50% better since her last visit   Started PT  Optimized gabapentin   Still having some hip and leg pain but much better than before      New pain was aching pain down the left posterior thigh with some numbness   Was new in the last 3 monhts   Starts in the buttock and radiates  Worse standing and walking   Massaging helps  Ice helps   Sleeps ok at night      Saw PCP   Had hip imaging - unremarkable for this pain   Was advised to start PT but no orders given   Also advised to f.u to discuss interventions with our team    TRX   NEVRO SCS - 75% improvement in lower back pain      MEDS   Gabapentin TID         Review of Systems    Current Outpatient Medications:     acetaminophen (TYLENOL) 325 mg tablet, Take two tablets by mouth twice daily., Disp: , Rfl:     alendronate (FOSAMAX) 70 mg tablet, Take one tablet by mouth every 7 days., Disp: , Rfl:     amLODIPine (NORVASC) 5 mg tablet, TAKE ONE TABLET BY MOUTH TWICE DAILY, Disp: 60 tablet, Rfl: 11    aspirin EC (ASPIR-LOW) 81 mg tablet, Take one tablet by mouth daily., Disp: , Rfl:     Calcium Carbonate 600 mg calcium (1,500 mg) tab, Take one tablet by mouth twice daily., Disp: , Rfl:     carvediloL (COREG) 25 mg tablet, Take one tablet by mouth twice daily with meals. Take with food.  Indications: high blood pressure, Disp: 180 tablet, Rfl: 3    cephalexin (KEFLEX) 500 mg capsule, Take one capsule by mouth four times daily., Disp: 28 capsule, Rfl: 0    CHOLEcalciferoL (vitamin D3) (VITAMIN D3) 1,000 units tablet, Take one tablet by mouth daily., Disp: , Rfl:     ezetimibe (ZETIA) 10 mg tablet, Take one tablet by mouth daily., Disp: 90 tablet, Rfl: 1    gabapentin (NEURONTIN) 300 mg capsule, Take one capsule by mouth twice daily., Disp: , Rfl:     HYDROcodone/acetaminophen (NORCO) 5/325 mg tablet, Take one tablet by mouth every 6 hours as needed for Pain.  Max 8 tabs/day, Disp: 15 tablet, Rfl: 0    lisinopriL (ZESTRIL) 20 mg tablet, Take one tablet by mouth twice daily., Disp: , Rfl:     MULTIVITAMIN PO, Take 1 tablet by mouth daily., Disp: , Rfl:     rosuvastatin (CRESTOR) 20 mg tablet, Take one tablet by mouth daily., Disp: 90 tablet, Rfl: 3    Vit A,C & E-Lutein-Minerals 1,000 unit-200 mg-60 unit-2 mg tab, Take 1 tablet by mouth daily., Disp: , Rfl:     Current Facility-Administered Medications:     lidocaine PF 1% (10 mg/mL) injection 0.2 mL, 0.2 mL, Injection, PRN, Jerilynn Som, MD  No Known Allergies  Physical Exam  There were no vitals filed for this visit.  Oswestry Total Score:: (Patient-Rptd) 34     There is no height or weight on file to calculate BMI.  General: Alert, oriented, mild distress  HEENT: normocephalic  Resp: Non labored breathing, no distress  MS: Sits during call .  Behavior: Calm, cooperative, behavior and speech normal.            IMPRESSION:  1. Lumbar radiculopathy          PLAN:she is doing better since last visit   Suggest CT L spine since she still has some recurring pain that is new   Continue PT   Continue gabapentin TID   Plan to contact [facility] for discussion of program optimization   follow up after CT to determine further trx options if not better

## 2023-07-12 ENCOUNTER — Encounter: Admit: 2023-07-12 | Discharge: 2023-07-12 | Payer: MEDICARE

## 2023-07-12 NOTE — Progress Notes
This RN faxed CT L-spine order, patient face sheet, and most current office visit notes to [facility]. [phone]. Will notify patient to schedule.

## 2023-07-22 ENCOUNTER — Encounter: Admit: 2023-07-22 | Discharge: 2023-07-22 | Payer: MEDICARE

## 2023-07-22 MED ORDER — CARVEDILOL 25 MG PO TAB
ORAL_TABLET | ORAL | 3 refills | 90.00000 days | Status: AC
Start: 2023-07-22 — End: ?

## 2023-07-25 ENCOUNTER — Encounter: Admit: 2023-07-25 | Discharge: 2023-07-26

## 2023-07-28 ENCOUNTER — Encounter: Admit: 2023-07-28 | Discharge: 2023-07-28 | Payer: MEDICARE

## 2023-08-01 ENCOUNTER — Encounter: Admit: 2023-08-01 | Discharge: 2023-08-01 | Payer: MEDICARE

## 2023-08-03 ENCOUNTER — Encounter: Admit: 2023-08-03 | Discharge: 2023-08-03 | Payer: MEDICARE

## 2023-08-04 ENCOUNTER — Encounter: Admit: 2023-08-04 | Discharge: 2023-08-04 | Payer: MEDICARE

## 2023-08-04 ENCOUNTER — Ambulatory Visit: Admit: 2023-08-04 | Discharge: 2023-08-04 | Payer: MEDICARE

## 2023-08-04 DIAGNOSIS — I10 Essential (primary) hypertension: Secondary | ICD-10-CM

## 2023-08-04 DIAGNOSIS — R0989 Other specified symptoms and signs involving the circulatory and respiratory systems: Secondary | ICD-10-CM

## 2023-08-04 DIAGNOSIS — I2510 Atherosclerotic heart disease of native coronary artery without angina pectoris: Secondary | ICD-10-CM

## 2023-08-04 DIAGNOSIS — G453 Amaurosis fugax: Secondary | ICD-10-CM

## 2023-08-04 DIAGNOSIS — I6522 Occlusion and stenosis of left carotid artery: Secondary | ICD-10-CM

## 2023-08-04 DIAGNOSIS — I351 Nonrheumatic aortic (valve) insufficiency: Secondary | ICD-10-CM

## 2023-08-04 DIAGNOSIS — E7849 Other hyperlipidemia: Secondary | ICD-10-CM

## 2023-08-04 DIAGNOSIS — R011 Cardiac murmur, unspecified: Secondary | ICD-10-CM

## 2023-08-04 DIAGNOSIS — R55 Syncope and collapse: Secondary | ICD-10-CM

## 2023-08-04 NOTE — Progress Notes
Date of Service: 08/04/2023    Brianna Christensen is a 83 y.o. female.       HPI       Brianna Christensen is a 83 y.o. female.   with a history of primary hypertension, coronary artery calcification but no obstructive coronary artery disease, bilateral carotid bruit (L>R), status post evaluation with a carotid artery duplex on 10/07/2022 patient was found to have moderate stenosis at the left carotid arteries with velocities suggesting 50-69% stenosis, mild aortic valve stenosis (MG = 8 mmHg, 2D echo Doppler study performed in November 2023.       Patient was seen in the's by Dr. Chales Abrahams, he recommended a CT angiogram of the neck, this was performed in August 2024, it demonstrated less than 40% stenosis of the proximal internal carotid arteries bilaterally without any evidence of significant stenosis or flow-limiting lesions.    Patient continues to have right hip pain and low back pain, she has been seen in the spine center at [facility].  She did undergo a CT scan of the hip, she has a follow-up appointment in the next few days.    An event monitor performed in August 2024-average heart rate 74 bpm, no atrial fibrillation, no pauses, ventricular ectopic beats representing 1% burden, 2 episodes of NSVT the longest lasted 5 seconds, less than 1% supraventricular ectopic beats.    From a cardiac standpoint she remains stable without any symptoms of chest pain, no heart palpitations, no presyncope or syncope.         Vitals:    08/04/23 1420   BP: 121/65   BP Source: Arm, Left Upper   Pulse: 89   SpO2: 96%   O2 Device: None (Room air)   PainSc: Five   Weight: 85.8 kg (189 lb 3.2 oz)   Height: 167.6 cm (5' 6)     Body mass index is 30.54 kg/m?Marland Kitchen     Past Medical History  Patient Active Problem List    Diagnosis Date Noted    Left carotid artery stenosis 02/01/2023    Amaurosis fugax 02/01/2023    Left carotid bruit 02/05/2021     A carotid artery duplex dated 10/05/2022 demonstrated left carotid velocities suggestive of 50 to 69% stenosis, this may be spuriously elevated given minimal plaque, tortuosity in this reason was present, otherwise normal left internal carotid velocities.  Normal right carotid velocities.    12/27/2022-Carotid Duplex ([facility]): Velocities on today's examination are within normal limits.       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 Though FFR assessment suggested no significant obstructive coronary artery disease.  03/18/17 - MPI Stress Test at [facility] - 1. This study is probably normal with mild intensity attenuation in the anterior wall.  All segments are viable global left ventricular function is within normal limits other high risk indicators are not noted.  The patient has poor exercise capacity which placed the patient intermediate risk category but in light of the attenuation artifact anteriorly no definite regional perfusion defects are noted.  If the clinical suspicion of coronary disease is intermediate consider coronary CTA with FFR.        Essential hypertension 03/07/2017     Echo - 01/09/18 at [facility] - 1. Normal LV size with concentric remodeling and normal systolic function, EF ~ 60%;  No regional wall motion abnormalities. 2. Diastolic function is technically indeterminate.Normal size atria bilaterally 3. Aortic sclerosis without stenosis.  Mild to moderate regurgitation; 4. Visualized portion of the aortic root is within normal limits according to patient's body surface area. 5. Estimated peak systolic PA pressure =  24 mmHg; 6. No pericardial effusion  05/28/2022-ECHO: Left Ventricle: The left ventricular size is normal. Concentric remodeling. The left ventricular systolic function is normal. The visually estimated ejection fraction is 60%. There are no segmental wall motion abnormalities. Cannot determine left ventricular diastolic function. Right Ventricle: The right ventricular size is normal. The right ventricular systolic function is normal. Normal central venous pressure. Aortic Valve: The valve is sclerotic and is calcified. Borderline stenosis. Mild regurgitation. Tricuspid Valve: Normal valve structure. No stenosis. Mild regurgitation. Estimated Peak Systolic PA Pressure 26 mmHg. Normal central venous pressure            Fatigue 03/04/2017    Hyperlipidemia 03/04/2017     03/18/17 - MPI Stress Test at [facility] - 1. This study is probably normal with mild intensity attenuation in the anterior wall.  All segments are viable global left ventricular function is within normal limits other high risk indicators are not noted.  The patient has poor exercise capacity which placed the patient intermediate risk category but in light of the attenuation artifact anteriorly no definite regional perfusion defects are noted.  If the clinical suspicion of coronary disease is intermediate consider coronary CTA with FFR.      Near syncope 03/04/2017    Murmur 03/03/2017     02/25/2017 Echo at [facility]  Mitral valve leaflets appear mildly thickened.  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  The mitral regurgitation jet was central. 2.  Trileaflet aortic valve.  The aortic cusps appear mildly thickened.  Aortic cusps appear mildly calcified.  The peak transaortic gradient was 17.00 mmHg.  The mean transaortic gradient was 7.00 mmHg.  Moderate aortic valve regurgitation.  Normal LV systolic function.  LV EF 62%.  Normal left ventricular wall thickness.            Review of Systems   Constitutional: Negative.   HENT: Negative.     Eyes: Negative.    Cardiovascular:  Positive for dyspnea on exertion.   Respiratory: Negative.     Endocrine: Negative.    Hematologic/Lymphatic: Negative.    Skin: Negative.    Musculoskeletal:  Positive for joint pain.   Gastrointestinal: Negative.    Genitourinary: Negative.    Neurological: Negative. Psychiatric/Behavioral: Negative.     Allergic/Immunologic: Negative.        Physical Exam  General Appearance: normal in appearance  Skin: warm, moist, no ulcers or xanthomas  Eyes: conjunctivae and lids normal, pupils are equal and round  Lips & Oral Mucosa: no pallor or cyanosis  Neck Veins: neck veins are flat, neck veins are not distended  Chest Inspection: chest is normal in appearance  Respiratory Effort: breathing comfortably, no respiratory distress  Auscultation/Percussion: lungs clear to auscultation, no rales or rhonchi, no wheezing  Cardiac Rhythm: regular rhythm and normal rate  Cardiac Auscultation: S1, S2 normal, no rub, no gallop  Murmurs: 2/6 systolic murmur at the right upper sternal border  Carotid Arteries: normal carotid upstroke bilaterally, bilateral carotid bruits  Lower Extremity Edema: no lower extremity edema  Abdominal Exam: soft, non-tender, no masses, bowel sounds normal  Liver & Spleen: no organomegaly  Language and Memory: patient responsive and seems to comprehend information  Neurologic Exam:  neurological assessment grossly intact      Cardiovascular Studies      Cardiovascular Health Factors  Vitals BP Readings from Last 3 Encounters:   08/04/23 121/65   05/17/23 (!) 147/75   04/14/23 (!) 188/73     Wt Readings from Last 3 Encounters:   08/04/23 85.8 kg (189 lb 3.2 oz)   05/17/23 83 kg (183 lb)   04/14/23 83.6 kg (184 lb 6.4 oz)     BMI Readings from Last 3 Encounters:   08/04/23 30.54 kg/m?   05/17/23 29.54 kg/m?   04/14/23 29.32 kg/m?      Smoking Social History     Tobacco Use   Smoking Status Never   Smokeless Tobacco Never      Lipid Profile Cholesterol   Date Value Ref Range Status   02/06/2021 162  Final     HDL   Date Value Ref Range Status   02/06/2021 62  Final     LDL   Date Value Ref Range Status   02/06/2021 77  Final     Triglycerides   Date Value Ref Range Status   02/06/2021 115  Final      Blood Sugar No results found for: HGBA1C  Glucose   Date Value Ref Range Status   04/27/2023 104  Final   05/07/2022 98  Final   06/24/2021 103  Final          Problems Addressed Today  Encounter Diagnoses   Name Primary?    Near syncope Yes    Nonrheumatic aortic valve insufficiency     Left carotid artery stenosis     Other hyperlipidemia     Essential hypertension     Coronary artery calcification     Amaurosis fugax     Left carotid bruit     Murmur        Assessment and Plan     Assessment:    1.  Nonobstructive bilateral carotid artery disease  Patient does have bilateral carotid bruit  She was evaluated with a CT angiogram in August 2024-less than 40% stenosis in bilateral internal carotid arteries  2.  Systolic murmur due to mild aortic valve stenosis  This was evaluated by an echocardiogram performed in November 2023, MG = 8 mmHg  3.  Heart palpitations  Patient was evaluated with an event monitor in August 2024-see above results  4.  Overall low burden PVCs and PACs  5.  NSVT-the longest run lasted 5 seconds  6.  Primary hypertension-on 3 antihypertensive therapy  7.  Coronary artery calcification  Patient did undergo an FFR study by the coronary CTA, it was not significant  Perfusion imaging study performed in August 2018-negative for ischemia, LVEF = 66%  8.  Hyperlipidemia-on statin therapy and ezetimibe  9.  Low back pain and right hip pain-patient uses a cane for ambulation    Plan:    1.  Discontinue ezetimibe  2.  Further evaluation with a 2D echo Doppler study in August-September 2025 followed by an office visit with me  3.  Continue to follow-up with the spine center at [facility], inquire if hip surgery is an option  4.  Follow-up office visit in September - October 2025.      Total Time Today was 40 minutes in the following activities: Preparing to see the patient, Obtaining and/or reviewing separately obtained history, Performing a medically appropriate examination and/or evaluation, Counseling and educating the patient/family/caregiver, Ordering medications, tests, or procedures, Referring  and communication with other health care professionals (when not separately reported), Documenting clinical information in the electronic or other health record, Independently interpreting results (not separately reported) and communicating results to the patient/family/caregiver, and Care coordination (not separately reported)          Current Medications (including today's revisions)   acetaminophen (TYLENOL) 325 mg tablet Take two tablets by mouth twice daily.    alendronate (FOSAMAX) 70 mg tablet Take one tablet by mouth every 7 days.    amLODIPine (NORVASC) 5 mg tablet TAKE ONE TABLET BY MOUTH TWICE DAILY    Calcium Carbonate 600 mg calcium (1,500 mg) tab Take one tablet by mouth twice daily.    carvediloL (COREG) 25 mg tablet TAKE ONE TABLET BY MOUTH TWICE DAILY WITH MEALS. (TAKE WITH FOOD). INDICATIONS: HIGH BLOOD PRESSURE    CHOLEcalciferoL (vitamin D3) (VITAMIN D3) 1,000 units tablet Take one tablet by mouth daily.    ezetimibe (ZETIA) 10 mg tablet Take one tablet by mouth daily.    gabapentin (NEURONTIN) 300 mg capsule Take one capsule by mouth twice daily.    HYDROcodone/acetaminophen (NORCO) 5/325 mg tablet Take one tablet by mouth every 6 hours as needed for Pain.  Max 8 tabs/day    lisinopriL (ZESTRIL) 20 mg tablet Take one tablet by mouth twice daily.    MULTIVITAMIN PO Take 1 tablet by mouth daily.    rosuvastatin (CRESTOR) 20 mg tablet Take one tablet by mouth daily.    Vit A,C & E-Lutein-Minerals 1,000 unit-200 mg-60 unit-2 mg tab Take 1 tablet by mouth daily.    zinc acetate (GALZIN) 50 mg (zinc) oral capsule Take one capsule by mouth daily.

## 2023-08-04 NOTE — Patient Instructions
Thank you for visiting our office today.    We would like to make the following medication adjustments:      STOP Zetia       Otherwise continue the same medications as you have been doing.          We will be pursuing the following tests after your appointment today:       Orders Placed This Encounter    2D + DOPPLER ECHO         We will plan to see you back in 7 months with echocardiogram prior.  Please call us in the meantime with any questions or concerns.        Please allow 5-7 business days for our providers to review your results. All normal results will go to MyChart. If you do not have Mychart, it is strongly recommended to get this so you can easily view all your results. If you do not have mychart, we will attempt to call you once with normal lab and testing results. If we cannot reach you by phone with normal results, we will send you a letter.  If you have not heard the results of your testing after one week please give [place] a call.       Your Cardiovascular Medicine [place] Team Brett Canales, Pilar Jarvis, Shawna Orleans, and [place])  phone number is [phone].

## 2023-08-11 ENCOUNTER — Encounter: Admit: 2023-08-11 | Discharge: 2023-08-11 | Payer: MEDICARE

## 2023-08-11 MED ORDER — EZETIMIBE 10 MG PO TAB
10 mg | ORAL_TABLET | Freq: Every day | ORAL | 3 refills | Status: AC
Start: 2023-08-11 — End: ?

## 2023-09-01 ENCOUNTER — Encounter: Admit: 2023-09-01 | Discharge: 2023-09-01 | Payer: MEDICARE

## 2023-09-04 ENCOUNTER — Encounter: Admit: 2023-09-04 | Discharge: 2023-09-04 | Payer: MEDICARE

## 2023-09-05 ENCOUNTER — Encounter: Admit: 2023-09-05 | Discharge: 2023-09-05 | Payer: MEDICARE

## 2023-09-07 ENCOUNTER — Ambulatory Visit: Admit: 2023-09-07 | Discharge: 2023-09-08 | Payer: MEDICARE

## 2023-09-07 ENCOUNTER — Encounter: Admit: 2023-09-07 | Discharge: 2023-09-07 | Payer: MEDICARE

## 2023-09-07 DIAGNOSIS — M48062 Spinal stenosis, lumbar region with neurogenic claudication: Secondary | ICD-10-CM

## 2023-09-07 DIAGNOSIS — M5416 Radiculopathy, lumbar region: Secondary | ICD-10-CM

## 2023-09-07 NOTE — Progress Notes
 Todays visit took place via phone  [facility]       SUBJECTIVE: Chronic back pain   S/p nevro implant   This has moderately helped her back   Had a new flare   This is 50% better since her last visit   Started PT since Oct 2024 and completed this session recently in the last couple weeks.   Optimized gabapentin   Still having some hip and leg pain but much better than before      New pain was aching pain down the left posterior thigh with some numbness   Was new in the last 6 monhts   Starts in the buttock and radiates  Worse standing and walking   Massaging helps  Ice helps   Sleeps ok at night      Saw PCP   Had hip imaging - unremarkable for this pain     TRX   NEVRO SCS - 75% improvement in lower back pain      MEDS   Gabapentin TID         Review of Systems    Current Outpatient Medications:     acetaminophen (TYLENOL) 325 mg tablet, Take two tablets by mouth twice daily., Disp: , Rfl:     alendronate (FOSAMAX) 70 mg tablet, Take one tablet by mouth every 7 days., Disp: , Rfl:     amLODIPine (NORVASC) 5 mg tablet, TAKE ONE TABLET BY MOUTH TWICE DAILY, Disp: 60 tablet, Rfl: 11    Calcium Carbonate 600 mg calcium (1,500 mg) tab, Take one tablet by mouth twice daily., Disp: , Rfl:     carvediloL (COREG) 25 mg tablet, TAKE ONE TABLET BY MOUTH TWICE DAILY WITH MEALS. (TAKE WITH FOOD). INDICATIONS: HIGH BLOOD PRESSURE, Disp: 180 tablet, Rfl: 3    CHOLEcalciferoL (vitamin D3) (VITAMIN D3) 1,000 units tablet, Take one tablet by mouth daily., Disp: , Rfl:     ezetimibe (ZETIA) 10 mg tablet, TAKE ONE TABLET BY MOUTH EVERY DAY, Disp: 90 tablet, Rfl: 3    gabapentin (NEURONTIN) 300 mg capsule, Take one capsule by mouth twice daily., Disp: , Rfl:     HYDROcodone/acetaminophen (NORCO) 5/325 mg tablet, Take one tablet by mouth every 6 hours as needed for Pain.  Max 8 tabs/day, Disp: 15 tablet, Rfl: 0    lisinopriL (ZESTRIL) 20 mg tablet, Take one tablet by mouth twice daily., Disp: , Rfl:     MULTIVITAMIN PO, Take 1 tablet by mouth daily., Disp: , Rfl:     rosuvastatin (CRESTOR) 20 mg tablet, Take one tablet by mouth daily., Disp: 90 tablet, Rfl: 3    Vit A,C & E-Lutein-Minerals 1,000 unit-200 mg-60 unit-2 mg tab, Take 1 tablet by mouth daily., Disp: , Rfl:     zinc acetate (GALZIN) 50 mg (zinc) oral capsule, Take one capsule by mouth daily., Disp: , Rfl:     Current Facility-Administered Medications:     lidocaine PF 1% (10 mg/mL) injection 0.2 mL, 0.2 mL, Injection, PRN, Jerilynn Som, MD  No Known Allergies  Physical Exam  There were no vitals filed for this visit.        There is no height or weight on file to calculate BMI.         IMPRESSION:  1. Lumbar radiculopathy    2. Lumbar stenosis with neurogenic claudication          PLAN:    reviewed CT L spine   Does have severe spinal  stenosis, disc and joint deterioration that impacts the spinal nerves to multiple levels   Continue PT at home but she can call to request new orders in future as needed  Continue gabapentin TID   Wants to discuss with her daughter before any future trx   I did let her know that if her SCS device helps, she can defer other trx   If she cont to have severe pain and functional limits, can request a surgical eval or re-assess spinal injections  Her age and other health factors may not make her an ideal surgical candidate     She did request her records   I offered to send her daughter an email to help set up MyChart for access   Will also send my note and results of CT via mail to the patient.   Pt thankful for this

## 2023-09-15 ENCOUNTER — Encounter: Admit: 2023-09-15 | Discharge: 2023-09-15 | Payer: MEDICARE

## 2023-11-22 ENCOUNTER — Encounter: Admit: 2023-11-22 | Discharge: 2023-11-22 | Payer: MEDICARE

## 2023-12-05 ENCOUNTER — Encounter: Admit: 2023-12-05 | Discharge: 2023-12-05 | Payer: MEDICARE

## 2023-12-05 MED ORDER — AMLODIPINE 5 MG PO TAB
5 mg | ORAL_TABLET | Freq: Two times a day (BID) | ORAL | 3 refills | 90.00000 days | Status: AC
Start: 2023-12-05 — End: ?

## 2024-01-25 ENCOUNTER — Encounter: Admit: 2024-01-25 | Discharge: 2024-01-25 | Payer: MEDICARE

## 2024-02-03 ENCOUNTER — Ambulatory Visit: Admit: 2024-02-03 | Discharge: 2024-02-03 | Payer: MEDICARE

## 2024-02-03 ENCOUNTER — Encounter: Admit: 2024-02-03 | Discharge: 2024-02-03 | Payer: MEDICARE

## 2024-02-06 ENCOUNTER — Encounter: Admit: 2024-02-06 | Discharge: 2024-02-06 | Payer: MEDICARE

## 2024-03-09 ENCOUNTER — Encounter: Admit: 2024-03-09 | Discharge: 2024-03-09 | Payer: MEDICARE

## 2024-03-13 ENCOUNTER — Encounter: Admit: 2024-03-13 | Discharge: 2024-03-13 | Payer: MEDICARE

## 2024-03-20 ENCOUNTER — Encounter: Admit: 2024-03-20 | Discharge: 2024-03-20 | Payer: MEDICARE

## 2024-03-20 ENCOUNTER — Ambulatory Visit: Admit: 2024-03-20 | Discharge: 2024-03-20 | Payer: MEDICARE

## 2024-03-20 DIAGNOSIS — R55 Syncope and collapse: Secondary | ICD-10-CM

## 2024-03-20 DIAGNOSIS — I351 Nonrheumatic aortic (valve) insufficiency: Principal | ICD-10-CM

## 2024-03-20 DIAGNOSIS — I10 Essential (primary) hypertension: Secondary | ICD-10-CM

## 2024-03-20 DIAGNOSIS — E7849 Other hyperlipidemia: Secondary | ICD-10-CM

## 2024-03-20 DIAGNOSIS — I2510 Atherosclerotic heart disease of native coronary artery without angina pectoris: Secondary | ICD-10-CM

## 2024-03-20 DIAGNOSIS — R0989 Other specified symptoms and signs involving the circulatory and respiratory systems: Secondary | ICD-10-CM

## 2024-03-20 DIAGNOSIS — I6522 Occlusion and stenosis of left carotid artery: Secondary | ICD-10-CM

## 2024-03-20 DIAGNOSIS — R5383 Other fatigue: Secondary | ICD-10-CM

## 2024-03-20 DIAGNOSIS — Z136 Encounter for screening for cardiovascular disorders: Secondary | ICD-10-CM

## 2024-03-20 MED ORDER — AMLODIPINE 2.5 MG PO TAB
2.5 mg | ORAL_TABLET | Freq: Every day | ORAL | 3 refills | 90.00000 days | Status: DC
Start: 2024-03-20 — End: 2024-03-20

## 2024-03-20 MED ORDER — AMLODIPINE 2.5 MG PO TAB
2.5 mg | ORAL_TABLET | Freq: Two times a day (BID) | ORAL | 3 refills | 90.00000 days | Status: AC
Start: 2024-03-20 — End: ?

## 2024-03-20 NOTE — Progress Notes
 Date of Service: 03/20/2024    Brianna Christensen is a 83 y.o. female.       HPI     Brianna Christensen is a 83 y.o. female with a history of primary hypertension, coronary artery calcification, no obstructive carotid artery disease, moderate left carotid artery, velocities 50 to 69%), mild aortic valve stenosis.    Patient was evaluated with a CT angiogram of the neck in August 2024, it demonstrated less than 40% stenosis of the proximal internal carotid arteries, it was no evidence of significant carotid artery stenosis.    The most recent 2D echo Doppler study dated 02/03/2024-LVEF = 64%, mild to moderate MR, sclerotic aortic valve, mild stenosis, MG = 11 mmHg, mild regurgitation, mild to moderate TR, estimated PAP = 24 mmHg.     Vitals:    03/20/24 1334   BP: 98/68   BP Source: Arm, Left Upper   Pulse: 82   SpO2: 97%   O2 Device: None (Room air)   PainSc: Five   Weight: 86.5 kg (190 lb 12.8 oz)   Height: 167.6 cm (5' 6)     Body mass index is 30.8 kg/m?SABRA     Past Medical History  Patient Active Problem List    Diagnosis Date Noted    Left carotid artery stenosis 02/01/2023    Amaurosis fugax 02/01/2023    Left carotid bruit 02/05/2021     A carotid artery duplex dated 10/05/2022 demonstrated left carotid velocities suggestive of 50 to 69% stenosis, this may be spuriously elevated given minimal plaque, tortuosity in this reason was present, otherwise normal left internal carotid velocities.  Normal right carotid velocities.    12/27/2022-Carotid Duplex ([facility]): Velocities on today's examination are within normal limits.       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 Though FFR assessment suggested no significant obstructive coronary artery disease.  03/18/17 - MPI Stress Test at [facility] - 1. This study is probably normal with mild intensity attenuation in the anterior wall.  All segments are viable global left ventricular function is within normal limits other high risk indicators are not noted.  The patient has poor exercise capacity which placed the patient intermediate risk category but in light of the attenuation artifact anteriorly no definite regional perfusion defects are noted.  If the clinical suspicion of coronary disease is intermediate consider coronary CTA with FFR.        Essential hypertension 03/07/2017     Echo - 01/09/18 at [facility] - 1. Normal LV size with concentric remodeling and normal systolic function, EF ~ 60%;  No regional wall motion abnormalities. 2. Diastolic function is technically indeterminate.Normal size atria bilaterally 3. Aortic sclerosis without stenosis.  Mild to moderate regurgitation; 4. Visualized portion of the aortic root is within normal limits according to patient's body surface area. 5. Estimated peak systolic PA pressure =  24 mmHg; 6. No pericardial effusion  05/28/2022-ECHO: Left Ventricle: The left ventricular size is normal. Concentric remodeling. The left ventricular systolic function is normal. The visually estimated ejection fraction is 60%. There are no segmental wall motion abnormalities. Cannot determine left ventricular diastolic function. Right Ventricle: The right ventricular size is normal. The right ventricular systolic function is normal. Normal central venous pressure. Aortic Valve: The valve is sclerotic and is calcified. Borderline stenosis. Mild  regurgitation. Tricuspid Valve: Normal valve structure. No stenosis. Mild regurgitation. Estimated Peak Systolic PA Pressure 26 mmHg. Normal central venous pressure            Fatigue 03/04/2017    Hyperlipidemia 03/04/2017     03/18/17 - MPI Stress Test at [facility] - 1. This study is probably normal with mild intensity attenuation in the anterior wall.  All segments are viable global left ventricular function is within normal limits other high risk indicators are not noted.  The patient has poor exercise capacity which placed the patient intermediate risk category but in light of the attenuation artifact anteriorly no definite regional perfusion defects are noted.  If the clinical suspicion of coronary disease is intermediate consider coronary CTA with FFR.      Near syncope 03/04/2017    Murmur 03/03/2017     02/25/2017 Echo at [facility]  Mitral valve leaflets appear mildly thickened.  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  The mitral regurgitation jet was central. 2.  Trileaflet aortic valve.  The aortic cusps appear mildly thickened.  Aortic cusps appear mildly calcified.  The peak transaortic gradient was 17.00 mmHg.  The mean transaortic gradient was 7.00 mmHg.  Moderate aortic valve regurgitation.  Normal LV systolic function.  LV EF 62%.  Normal left ventricular wall thickness.            Review of Systems   Constitutional: Positive for malaise/fatigue.   HENT: Negative.     Eyes: Negative.    Cardiovascular: Negative.    Respiratory: Negative.     Endocrine: Negative.    Hematologic/Lymphatic: Negative.    Skin: Negative.    Musculoskeletal:  Positive for back pain.   Gastrointestinal: Negative.    Genitourinary: Negative.    Neurological: Negative.    Psychiatric/Behavioral: Negative.     Allergic/Immunologic: Negative.        Physical Exam    General Appearance: normal in appearance  Skin: warm, moist, no ulcers or xanthomas  Eyes: conjunctivae and lids normal, pupils are equal and round  Lips & Oral Mucosa: no pallor or cyanosis  Neck Veins: neck veins are flat, neck veins are not distended  Chest Inspection: chest is normal in appearance  Respiratory Effort: breathing comfortably, no respiratory distress  Auscultation/Percussion: lungs clear to auscultation, no rales or rhonchi, no wheezing  Cardiac Rhythm: regular rhythm and normal rate  Cardiac Auscultation: S1, S2 normal, no rub, no gallop  Murmurs: 2/6 systolic murmur @ RUSB  Carotid Arteries: normal carotid upstroke bilaterally, no bruit  Lower Extremity Edema: no lower extremity edema  Abdominal Exam: soft, non-tender, no masses, bowel sounds normal  Liver & Spleen: no organomegaly  Language and Memory: patient responsive and seems to comprehend information  Neurologic Exam: neurological assessment grossly intact       Cardiovascular Studies      Cardiovascular Health Factors  Vitals BP Readings from Last 3 Encounters:   03/20/24 98/68   02/03/24 (!) 143/72   08/04/23 121/65     Wt Readings from Last 3 Encounters:   03/20/24 86.5 kg (190 lb 12.8 oz)   02/03/24 87.5 kg (193 lb)   08/04/23 85.8 kg (189 lb 3.2 oz)     BMI Readings from Last 3 Encounters:   03/20/24 30.80 kg/m?   02/03/24 31.15 kg/m?   08/04/23 30.54 kg/m?      Smoking Tobacco Use History[1]   Lipid Profile Cholesterol   Date Value Ref Range Status   02/06/2021 162  Final     HDL   Date Value Ref Range Status   02/06/2021 62  Final     LDL   Date Value Ref Range Status   02/06/2021 77  Final     Triglycerides   Date Value Ref Range Status   02/06/2021 115  Final      Blood Sugar No results found for: HGBA1C  Glucose   Date Value Ref Range Status   02/29/2024 104  Final   04/27/2023 104  Final   05/07/2022 98  Final          Problems Addressed Today  Encounter Diagnoses   Name Primary?    Nonrheumatic aortic valve insufficiency Yes    Near syncope     Left carotid artery stenosis     Other hyperlipidemia     Essential hypertension     Coronary artery calcification     Left carotid bruit     Fatigue, unspecified type     Screening for heart disease        Assessment and Plan     Assessment:    1.  Borderline hypotension  Currently patient is on a combination of amlodipine , carvedilol  and lisinopril   2.  Bilateral carotid bruit, nonobstructive carotid artery disease  3.  Mild aortic valve stenosis-2D echo Doppler study dated 02/03/2024 revealed MG = 11 mmHg  4.  History of heart palpitations-overall low burden PVCs and PACs, NSVT (5 seconds)-recorded on a previous Holter monitor  5.  Coronary artery calcification  Patient did undergo an FFR study by the coronary CTA that was not significant  Perfusion imaging study performed in August 2018-negative for ischemia  6.  Hyperlipidemia-on statin therapy    Plan:    1.  Decrease amlodipine  to 2.5 mg p.o. twice daily  2.  Continue all other medications  3.  2D echo Doppler study and follow-up office visit in approximately 10 to 12 months.      Total Time Today was 40 minutes in the following activities: Preparing to see the patient, Obtaining and/or reviewing separately obtained history, Performing a medically appropriate examination and/or evaluation, Counseling and educating the patient/family/caregiver, Ordering medications, tests, or procedures, Referring and communication with other health care professionals (when not separately reported), Documenting clinical information in the electronic or other health record, Independently interpreting results (not separately reported) and communicating results to the patient/family/caregiver, and Care coordination (not separately reported)          Current Medications (including today's revisions)   acetaminophen  (TYLENOL ) 325 mg tablet Take two tablets by mouth twice daily.    alendronate (FOSAMAX) 70 mg tablet Take one tablet by mouth every 7 days.    amLODIPine  (NORVASC ) 5 mg tablet TAKE 1 TABLET TWICE DAILY    Calcium Carbonate 600 mg calcium (1,500 mg) tab Take one tablet by mouth twice daily.    carvediloL  (COREG ) 25 mg tablet TAKE ONE TABLET BY MOUTH TWICE DAILY WITH MEALS. (TAKE WITH FOOD). INDICATIONS: HIGH BLOOD PRESSURE    CHOLEcalciferoL (vitamin D3) (VITAMIN D3) 1,000 units tablet Take one tablet by mouth daily.    gabapentin (NEURONTIN) 300 mg capsule Take one capsule by mouth three times daily.    lisinopriL  (ZESTRIL ) 20 mg tablet Take one tablet by mouth twice daily.    MULTIVITAMIN PO Take 1 tablet by mouth daily.    rosuvastatin  (CRESTOR ) 20 mg tablet Take one tablet by mouth daily.    zinc acetate (GALZIN) 50 mg (zinc) oral capsule  Take one capsule by mouth daily.                 [1]   Social History  Tobacco Use   Smoking Status Never   Smokeless Tobacco Never

## 2024-03-20 NOTE — Patient Instructions
 Thank you for visiting our office today.    We would like to make the following medication adjustments:      Change amlodipine  to 2.5 mg twice daily.       Otherwise continue the same medications as you have been doing.          We will be pursuing the following tests after your appointment today:     Echocardiogram before returning in June/July of next year. Scheduling will call to schedule this and your office visit appointment.         We will plan to see you back in June/July of 2026.  Please call us  in the meantime with any questions or concerns.        Please allow 5-7 business days for our providers to review your results. All normal results will go to MyChart. If you do not have Mychart, it is strongly recommended to get this so you can easily view all your results. If you do not have mychart, we will attempt to call you once with normal lab and testing results. If we cannot reach you by phone with normal results, we will send you a letter.  If you have not heard the results of your testing after one week please give us  a call.       Your Cardiovascular Medicine [place] Team [place], [place] and [place])  phone number is [phone].

## 2024-05-09 ENCOUNTER — Encounter: Admit: 2024-05-09 | Discharge: 2024-05-09 | Payer: MEDICARE

## 2024-05-09 MED ORDER — CARVEDILOL 25 MG PO TAB
ORAL_TABLET | ORAL | 3 refills | 90.00000 days | Status: AC
Start: 2024-05-09 — End: ?
